# Patient Record
Sex: Female | Born: 1935 | Race: White | Hispanic: No | Marital: Married | Smoking: Never smoker
Health system: Southern US, Community
[De-identification: ages and names within clinical notes are randomized; demographics above are authoritative.]

## PROBLEM LIST (undated history)

## (undated) DIAGNOSIS — G3184 Mild cognitive impairment, so stated: Secondary | ICD-10-CM

## (undated) DIAGNOSIS — R413 Other amnesia: Secondary | ICD-10-CM

## (undated) DIAGNOSIS — Z86010 Personal history of colonic polyps: Secondary | ICD-10-CM

## (undated) DIAGNOSIS — M1990 Unspecified osteoarthritis, unspecified site: Secondary | ICD-10-CM

## (undated) DIAGNOSIS — M810 Age-related osteoporosis without current pathological fracture: Secondary | ICD-10-CM

## (undated) HISTORY — DX: Age-related osteoporosis without current pathological fracture: M81.0

## (undated) HISTORY — DX: Other amnesia: R41.3

## (undated) HISTORY — DX: Personal history of colonic polyps: Z86.010

## (undated) HISTORY — PX: DILATION AND CURETTAGE OF UTERUS: SHX78

## (undated) HISTORY — DX: Unspecified osteoarthritis, unspecified site: M19.90

## (undated) HISTORY — PX: ABDOMINAL HYSTERECTOMY: SHX81

## (undated) HISTORY — PX: TOTAL HIP REVISION: SHX763

## (undated) HISTORY — DX: Mild cognitive impairment, so stated: G31.84

---

## 1999-04-10 ENCOUNTER — Other Ambulatory Visit: Admission: RE | Admit: 1999-04-10 | Discharge: 1999-04-10 | Payer: Self-pay | Admitting: Internal Medicine

## 1999-05-14 ENCOUNTER — Other Ambulatory Visit: Admission: RE | Admit: 1999-05-14 | Discharge: 1999-05-14 | Payer: Self-pay | Admitting: Internal Medicine

## 1999-05-14 ENCOUNTER — Encounter (INDEPENDENT_AMBULATORY_CARE_PROVIDER_SITE_OTHER): Payer: Self-pay

## 2000-02-19 ENCOUNTER — Encounter (INDEPENDENT_AMBULATORY_CARE_PROVIDER_SITE_OTHER): Payer: Self-pay

## 2000-02-19 ENCOUNTER — Other Ambulatory Visit: Admission: RE | Admit: 2000-02-19 | Discharge: 2000-02-19 | Payer: Self-pay | Admitting: Internal Medicine

## 2000-08-26 ENCOUNTER — Encounter: Payer: Self-pay | Admitting: Orthopedic Surgery

## 2000-09-02 ENCOUNTER — Inpatient Hospital Stay (HOSPITAL_COMMUNITY): Admission: RE | Admit: 2000-09-02 | Discharge: 2000-09-05 | Payer: Self-pay | Admitting: Orthopedic Surgery

## 2003-12-05 ENCOUNTER — Ambulatory Visit: Payer: Self-pay | Admitting: Internal Medicine

## 2004-11-08 ENCOUNTER — Ambulatory Visit: Payer: Self-pay | Admitting: Internal Medicine

## 2005-05-26 ENCOUNTER — Ambulatory Visit: Payer: Self-pay | Admitting: Internal Medicine

## 2005-11-18 ENCOUNTER — Ambulatory Visit: Payer: Self-pay | Admitting: Internal Medicine

## 2005-11-18 LAB — CONVERTED CEMR LAB
ALT: 17 units/L (ref 0–40)
AST: 22 units/L (ref 0–37)
Albumin: 3.8 g/dL (ref 3.5–5.2)
Alkaline Phosphatase: 52 units/L (ref 39–117)
BUN: 19 mg/dL (ref 6–23)
Bilirubin, Direct: 0.1 mg/dL (ref 0.0–0.3)
CO2: 32 meq/L (ref 19–32)
Calcium: 9 mg/dL (ref 8.4–10.5)
Chloride: 104 meq/L (ref 96–112)
Chol/HDL Ratio, serum: 3.3
Cholesterol: 210 mg/dL (ref 0–200)
Creatinine, Ser: 0.7 mg/dL (ref 0.4–1.2)
GFR calc non Af Amer: 85 mL/min
Glomerular Filtration Rate, Af Am: 103 mL/min/{1.73_m2}
Glucose, Bld: 82 mg/dL (ref 70–99)
HCT: 38.8 % (ref 36.0–46.0)
HDL: 63.6 mg/dL (ref >39.0)
Hemoglobin: 13.2 g/dL (ref 12.0–15.0)
LDL DIRECT: 120.2 mg/dL
MCHC: 34 g/dL (ref 30.0–36.0)
MCV: 95.9 fL (ref 78.0–100.0)
Platelets: 276 10*3/uL (ref 150–400)
Potassium: 3.7 meq/L (ref 3.5–5.1)
RBC: 4.05 M/uL (ref 3.87–5.11)
RDW: 12.6 % (ref 11.5–14.6)
Sodium: 141 meq/L (ref 135–145)
TSH: 2.13 microintl units/mL (ref 0.35–5.50)
Total Bilirubin: 0.8 mg/dL (ref 0.3–1.2)
Total Protein: 7 g/dL (ref 6.0–8.3)
Triglyceride fasting, serum: 82 mg/dL (ref 0–149)
VLDL: 16 mg/dL (ref 0–40)
WBC: 7.4 10*3/uL (ref 4.5–10.5)

## 2006-10-16 ENCOUNTER — Encounter (INDEPENDENT_AMBULATORY_CARE_PROVIDER_SITE_OTHER): Payer: Self-pay | Admitting: Obstetrics & Gynecology

## 2006-10-16 ENCOUNTER — Inpatient Hospital Stay (HOSPITAL_COMMUNITY): Admission: RE | Admit: 2006-10-16 | Discharge: 2006-10-17 | Payer: Self-pay | Admitting: *Deleted

## 2006-10-21 ENCOUNTER — Telehealth: Payer: Self-pay | Admitting: Internal Medicine

## 2006-11-25 DIAGNOSIS — M810 Age-related osteoporosis without current pathological fracture: Secondary | ICD-10-CM | POA: Insufficient documentation

## 2006-11-25 DIAGNOSIS — Z860100 Personal history of colon polyps, unspecified: Secondary | ICD-10-CM | POA: Insufficient documentation

## 2006-11-25 HISTORY — DX: Personal history of colon polyps, unspecified: Z86.0100

## 2006-11-25 HISTORY — DX: Age-related osteoporosis without current pathological fracture: M81.0

## 2006-11-25 HISTORY — DX: Personal history of colonic polyps: Z86.010

## 2006-11-30 ENCOUNTER — Ambulatory Visit: Payer: Self-pay | Admitting: Internal Medicine

## 2006-11-30 DIAGNOSIS — M1990 Unspecified osteoarthritis, unspecified site: Secondary | ICD-10-CM

## 2006-11-30 HISTORY — DX: Unspecified osteoarthritis, unspecified site: M19.90

## 2006-12-14 ENCOUNTER — Ambulatory Visit (HOSPITAL_BASED_OUTPATIENT_CLINIC_OR_DEPARTMENT_OTHER): Admission: RE | Admit: 2006-12-14 | Discharge: 2006-12-14 | Payer: Self-pay | Admitting: Orthopedic Surgery

## 2007-04-07 ENCOUNTER — Telehealth: Payer: Self-pay | Admitting: Internal Medicine

## 2007-06-22 ENCOUNTER — Telehealth: Payer: Self-pay | Admitting: Internal Medicine

## 2007-10-20 ENCOUNTER — Telehealth: Payer: Self-pay | Admitting: Internal Medicine

## 2008-01-04 ENCOUNTER — Ambulatory Visit: Payer: Self-pay | Admitting: Internal Medicine

## 2008-01-13 ENCOUNTER — Emergency Department (HOSPITAL_COMMUNITY): Admission: EM | Admit: 2008-01-13 | Discharge: 2008-01-13 | Payer: Self-pay | Admitting: Family Medicine

## 2008-05-31 ENCOUNTER — Ambulatory Visit: Payer: Self-pay | Admitting: Internal Medicine

## 2008-05-31 DIAGNOSIS — G3184 Mild cognitive impairment, so stated: Secondary | ICD-10-CM

## 2008-05-31 DIAGNOSIS — R413 Other amnesia: Secondary | ICD-10-CM

## 2008-05-31 HISTORY — DX: Other amnesia: R41.3

## 2008-05-31 HISTORY — DX: Mild cognitive impairment of uncertain or unknown etiology: G31.84

## 2008-06-06 ENCOUNTER — Telehealth: Payer: Self-pay | Admitting: Internal Medicine

## 2008-06-07 ENCOUNTER — Encounter: Payer: Self-pay | Admitting: Internal Medicine

## 2009-01-08 ENCOUNTER — Ambulatory Visit: Payer: Self-pay | Admitting: Internal Medicine

## 2009-03-13 ENCOUNTER — Telehealth: Payer: Self-pay | Admitting: Internal Medicine

## 2009-03-28 ENCOUNTER — Telehealth: Payer: Self-pay | Admitting: Internal Medicine

## 2009-04-02 ENCOUNTER — Telehealth: Payer: Self-pay

## 2009-07-05 ENCOUNTER — Encounter: Payer: Self-pay | Admitting: Internal Medicine

## 2009-07-09 ENCOUNTER — Ambulatory Visit: Payer: Self-pay | Admitting: Internal Medicine

## 2009-09-06 ENCOUNTER — Encounter: Payer: Self-pay | Admitting: Internal Medicine

## 2010-01-10 ENCOUNTER — Ambulatory Visit: Payer: Self-pay | Admitting: Internal Medicine

## 2010-01-10 ENCOUNTER — Encounter: Payer: Self-pay | Admitting: Internal Medicine

## 2010-01-10 LAB — CONVERTED CEMR LAB
AST: 23 units/L (ref 0–37)
Albumin: 3.8 g/dL (ref 3.5–5.2)
Alkaline Phosphatase: 60 units/L (ref 39–117)
BUN: 22 mg/dL (ref 6–23)
Basophils Absolute: 0.1 10*3/uL (ref 0.0–0.1)
CO2: 31 meq/L (ref 19–32)
Chloride: 106 meq/L (ref 96–112)
Creatinine, Ser: 0.8 mg/dL (ref 0.4–1.2)
Eosinophils Absolute: 0.1 10*3/uL (ref 0.0–0.7)
Eosinophils Relative: 1.7 % (ref 0.0–5.0)
Glucose, Bld: 84 mg/dL (ref 70–99)
HCT: 39.7 % (ref 36.0–46.0)
Lymphs Abs: 2.4 10*3/uL (ref 0.7–4.0)
MCHC: 34.3 g/dL (ref 30.0–36.0)
MCV: 99.4 fL (ref 78.0–100.0)
Monocytes Absolute: 0.6 10*3/uL (ref 0.1–1.0)
Neutrophils Relative %: 59.6 % (ref 43.0–77.0)
Platelets: 271 10*3/uL (ref 150.0–400.0)
Potassium: 4.2 meq/L (ref 3.5–5.1)
RDW: 13.8 % (ref 11.5–14.6)
Total Protein: 6.6 g/dL (ref 6.0–8.3)

## 2010-02-24 LAB — CONVERTED CEMR LAB
ALT: 18 units/L (ref 0–35)
ALT: 21 units/L (ref 0–35)
AST: 22 units/L (ref 0–37)
AST: 25 units/L (ref 0–37)
Albumin: 3.6 g/dL (ref 3.5–5.2)
Albumin: 3.7 g/dL (ref 3.5–5.2)
Alkaline Phosphatase: 57 units/L (ref 39–117)
Alkaline Phosphatase: 57 units/L (ref 39–117)
BUN: 17 mg/dL (ref 6–23)
BUN: 19 mg/dL (ref 6–23)
Basophils Absolute: 0.1 10*3/uL (ref 0.0–0.1)
Basophils Absolute: 0.1 10*3/uL (ref 0.0–0.1)
Basophils Relative: 1.2 % (ref 0.0–3.0)
Basophils Relative: 1.3 % (ref 0.0–3.0)
Bilirubin, Direct: 0.1 mg/dL (ref 0.0–0.3)
Bilirubin, Direct: 0.1 mg/dL (ref 0.0–0.3)
CO2: 31 meq/L (ref 19–32)
CO2: 32 meq/L (ref 19–32)
Calcium: 9.1 mg/dL (ref 8.4–10.5)
Calcium: 9.1 mg/dL (ref 8.4–10.5)
Chloride: 104 meq/L (ref 96–112)
Chloride: 108 meq/L (ref 96–112)
Cholesterol: 210 mg/dL (ref 0–200)
Cholesterol: 213 mg/dL — ABNORMAL HIGH (ref 0–200)
Creatinine, Ser: 0.7 mg/dL (ref 0.4–1.2)
Creatinine, Ser: 0.7 mg/dL (ref 0.4–1.2)
Direct LDL: 114.2 mg/dL
Direct LDL: 117.4 mg/dL
Eosinophils Absolute: 0.1 10*3/uL (ref 0.0–0.7)
Eosinophils Absolute: 0.1 10*3/uL (ref 0.0–0.7)
Eosinophils Relative: 0.8 % (ref 0.0–5.0)
Eosinophils Relative: 2 % (ref 0.0–5.0)
GFR calc Af Amer: 103 mL/min
GFR calc non Af Amer: 84.57 mL/min (ref >60)
GFR calc non Af Amer: 85 mL/min
Glucose, Bld: 88 mg/dL (ref 70–99)
Glucose, Bld: 88 mg/dL (ref 70–99)
HCT: 37.9 % (ref 36.0–46.0)
HCT: 38.9 % (ref 36.0–46.0)
HDL: 77.8 mg/dL (ref >39.00)
HDL: 84.3 mg/dL (ref >39.0)
Hemoglobin: 13.2 g/dL (ref 12.0–15.0)
Hemoglobin: 13.2 g/dL (ref 12.0–15.0)
LDL Cholesterol: 114 mg/dL — ABNORMAL HIGH (ref 0–99)
Lymphocytes Relative: 24.7 % (ref 12.0–46.0)
Lymphocytes Relative: 29.3 % (ref 12.0–46.0)
Lymphs Abs: 2.1 10*3/uL (ref 0.7–4.0)
MCHC: 33.9 g/dL (ref 30.0–36.0)
MCHC: 34.9 g/dL (ref 30.0–36.0)
MCV: 100.6 fL — ABNORMAL HIGH (ref 78.0–100.0)
MCV: 97.9 fL (ref 78.0–100.0)
Monocytes Absolute: 0.5 10*3/uL (ref 0.1–1.0)
Monocytes Absolute: 0.7 10*3/uL (ref 0.1–1.0)
Monocytes Relative: 6 % (ref 3.0–12.0)
Monocytes Relative: 9.4 % (ref 3.0–12.0)
Neutro Abs: 4.1 10*3/uL (ref 1.4–7.7)
Neutro Abs: 6.2 10*3/uL (ref 1.4–7.7)
Neutrophils Relative %: 58 % (ref 43.0–77.0)
Neutrophils Relative %: 67.3 % (ref 43.0–77.0)
Platelets: 261 10*3/uL (ref 150.0–400.0)
Platelets: 272 10*3/uL (ref 150–400)
Potassium: 3.8 meq/L (ref 3.5–5.1)
Potassium: 4.1 meq/L (ref 3.5–5.1)
RBC: 3.76 M/uL — ABNORMAL LOW (ref 3.87–5.11)
RBC: 3.97 M/uL (ref 3.87–5.11)
RDW: 12.2 % (ref 11.5–14.6)
RDW: 12.5 % (ref 11.5–14.6)
Sodium: 140 meq/L (ref 135–145)
Sodium: 144 meq/L (ref 135–145)
TSH: 1.63 microintl units/mL (ref 0.35–5.50)
TSH: 2.43 microintl units/mL (ref 0.35–5.50)
Total Bilirubin: 0.9 mg/dL (ref 0.3–1.2)
Total Bilirubin: 1 mg/dL (ref 0.3–1.2)
Total CHOL/HDL Ratio: 2.5
Total CHOL/HDL Ratio: 3
Total Protein: 6.9 g/dL (ref 6.0–8.3)
Total Protein: 6.9 g/dL (ref 6.0–8.3)
Triglycerides: 57 mg/dL (ref 0–149)
Triglycerides: 96 mg/dL (ref 0.0–149.0)
VLDL: 11 mg/dL (ref 0–40)
VLDL: 19.2 mg/dL (ref 0.0–40.0)
WBC: 7.1 10*3/uL (ref 4.5–10.5)
WBC: 9.1 10*3/uL (ref 4.5–10.5)

## 2010-02-28 NOTE — Progress Notes (Signed)
Summary: [facility] ok Brand Aricept only  Phone Note From Pharmacy   Caller: [facility]* Call For: Kim  Summary of Call: Jill Side - pharmacist with [facility] call - generic Aricept will save pt - over $200 per year - if reason for brand only - needs to be added to chart. Call [phone]  opt 1  ref#[phone]. Initial call taken by: Duard Brady LPN,  April 02, 2009 12:01 PM  Follow-up for Phone Call        spoke with [facility] - Brand medically necessary - adverse reaction to generic. Change to chart made.   Follow-up by: Duard Brady LPN,  April 02, 2009 12:02 PM   New Allergies: ! * DONEPEZIL HCL New Allergies: ! * DONEPEZIL HCL  Appended Document: [facility] ok Brand Aricept only Called and spoke with patient / patients husband...  Advised them that PA was approved for medication: Aricept per fax from [facility] / document given to me by [facility].

## 2010-02-28 NOTE — Progress Notes (Signed)
Summary: CONCERNS W/ MEDICATION - Aricelpt brand only   Phone Note Call from Patient   Caller: Spouse [phone] Reason for Call: Talk to Nurse, Talk to Doctor Summary of Call: Pts husband Jomarie Longs) called to adv that his wife took generic med for Aricept (Donepezil Hydrochloride) for the first time last night and was up all night.... Pts husband states that he read insomnia was a side effect of that med, therefore spouse wants to know if pt should go back to brand name (aricept) or if medication can be given during another time of day other than at night..??   He req to speak with Dr Kirtland Bouchard or RN ref to medication change or change in time of day med is taken.... Pt / Pts husband can be reached at [phone]  or  [phone].  Initial call taken by: Debbra Riding,  March 28, 2009 9:10 AM  Follow-up for Phone Call        OK to resume branded aricept and call in new Rx if needed Follow-up by: Gordy Savers  MD,  March 28, 2009 12:19 PM  Additional Follow-up for Phone Call Additional follow up Details #1::        New Rx for aricept to [facility]. Additional Follow-up by: Debbra Riding,  March 28, 2009 1:39 PM    Additional Follow-up for Phone Call Additional follow up Details #2::    spoke with husband - informed that Dr. Venetia Night - will call out to [facility] , they will need short term rx called to [facility]. [facility] . KIK Follow-up by: Duard Brady LPN,  March 29, 2009 11:47 AM  Prescriptions: ARICEPT 5 MG TABS (DONEPEZIL HCL) one daily  #90 x 4   Entered by:   Duard Brady LPN   Authorized by:   Gordy Savers  MD   Signed by:   Duard Brady LPN on 18/84/1660   Method used:   Electronically to        [facility]* (mail-order)             ,          Ph: [phone]       Fax: [phone]   RxID:   [id number]  #30 called to [facility] - Brand medically necessary.  KIK

## 2010-02-28 NOTE — Assessment & Plan Note (Signed)
Summary: emp/pt coming in fasting/cjr   Vital Signs:  Patient profile:   77 year old female Height:      58.5 inches Weight:      139 pounds BMI:     28.66 Temp:     98.2 degrees F oral BP sitting:   126 / 78  (left arm) Cuff size:   regular  Vitals Entered By: Duard Brady LPN (January 10, 2010 8:40 AM) CC: cpx - doing ok Is Patient Diabetic? No   CC:  cpx - doing ok.  History of Present Illness: 29 -year-old patient who is seen today for a health maintenance examination.medical problems include mild cognitive impairment.  She has osteoporosis and osteoarthritis.  She has a history of colonic polyps.  She is well today without concerns or complaints.  She has been on Fosamax for an excess of 5 years.  Here for Medicare AWV:  1.   Risk factors based on Past M, S, F history:  cardiovascular risk factors include age 26.   Physical Activities: fairly active.  Mild exercise limitations due to arthritis 3.   Depression/mood: history depression, or mood disorder 4.   Hearing: no major deficits 5.   ADL's: independent in all aspects of daily living 6.   Fall Risk: moderate due to age 106.   Home Safety: no problems identified 8.   Height, weight, &visual acuity:height and weight stable.  No difficulty with visual acuity appeared 9.   Counseling: calcium and vitamin D supplementation, and encouraged 10.   Labs ordered based on risk factors: laboratory profile will be reviewed 11.           Referral Coordination-  none appropriate at this time 12.           Care Plan- heart healthy diet more regular exercise.  Encouraged 13.            Cognitive Assessment- alert and oriented, with normal affect.  History mild cognitive dysfunction   Allergies: 1)  ! * Donepezil Hcl  Past History:  Past Medical History: Reviewed history from 05/31/2008 and no changes required. Colonic polyps, hx of Osteoporosis Osteoarthritis insomnia cognitive impairment  Past Surgical  History: Reviewed history from 01/04/2008 and no changes required. 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 Family History Seizures father died age 53, history of seizure disorder mother  accidental  death at age 100 one brother one sister in good health other than osteoarthritis  Social History: Reviewed history from 11/25/2006 and no changes required. Retired Married Never Smoked  Review of Systems  The patient denies anorexia, fever, weight loss, weight gain, vision loss, decreased hearing, hoarseness, chest pain, syncope, dyspnea on exertion, peripheral edema, prolonged cough, headaches, hemoptysis, abdominal pain, melena, hematochezia, severe indigestion/heartburn, hematuria, incontinence, genital sores, muscle weakness, suspicious skin lesions, transient blindness, difficulty walking, depression, unusual weight change, abnormal bleeding, enlarged lymph nodes, angioedema, and breast masses.    Physical Exam  General:  Well-developed,well-nourished,in no acute distress; alert,appropriate and cooperative throughout examination Head:  Normocephalic and atraumatic without obvious abnormalities. No apparent alopecia or balding. Eyes:  No corneal or conjunctival inflammation noted. EOMI. Perrla. Funduscopic exam benign, without hemorrhages, exudates or papilledema. Vision grossly normal. Ears:  External ear exam shows no significant lesions or deformities.  Otoscopic examination reveals clear canals, tympanic membranes are intact bilaterally without bulging, retraction,  inflammation or discharge. Hearing is grossly normal bilaterally. Nose:  External nasal examination shows no deformity or inflammation. Nasal mucosa are pink and moist without lesions or exudates. Mouth:  Oral mucosa and oropharynx without lesions or  exudates.  Teeth in good repair. Neck:  No deformities, masses, or tenderness noted. Chest Wall:  No deformities, masses, or tenderness noted. Breasts:  No mass, nodules, thickening, tenderness, bulging, retraction, inflamation, nipple discharge or skin changes noted.   Lungs:  Normal respiratory effort, chest expands symmetrically. Lungs are clear to auscultation, no crackles or wheezes. Heart:  Normal rate and regular rhythm. S1 and S2 normal without gallop, murmur, click, rub or other extra sounds. Abdomen:  Bowel sounds positive,abdomen soft and non-tender without masses, organomegaly or hernias noted. Rectal:  No external abnormalities noted. Normal sphincter tone. No rectal masses or tenderness. Genitalia:  scarring along the anterior vaginal wall, possible small cystocele Msk:  No deformity or scoliosis noted of thoracic or lumbar spine.   Pulses:  R and L carotid,radial,femoral,dorsalis pedis and posterior tibial pulses are full and equal bilaterally Extremities:  No clubbing, cyanosis, edema, or deformity noted with normal full range of motion of all joints.   Neurologic:  No cranial nerve deficits noted. Station and gait are normal. Plantar reflexes are down-going bilaterally. DTRs are symmetrical throughout. Sensory, motor and coordinative functions appear intact. Skin:  Intact without suspicious lesions or rashes Cervical Nodes:  No lymphadenopathy noted Axillary Nodes:  No palpable lymphadenopathy Inguinal Nodes:  No significant adenopathy Psych:  Cognition and judgment appear intact. Alert and cooperative with normal attention span and concentration. No apparent delusions, illusions, hallucinations   Impression & Recommendations:  Problem # 1:  PREVENTIVE HEALTH CARE (ICD-V70.0)  Orders: EKG w/ Interpretation (93000) Medicare -1st Annual Wellness Visit [phone]) Venipuncture (26948) TLB-BMP (Basic Metabolic Panel-BMET) (80048-METABOL) TLB-CBC Platelet - w/Differential  (85025-CBCD) TLB-Hepatic/Liver Function Pnl (80076-HEPATIC) TLB-TSH (Thyroid Stimulating Hormone) (84443-TSH) Specimen Handling (54627)  Complete Medication List: 1)  Temazepam 15 Mg Caps (Temazepam) .Marland Kitchen.. 1 at bedtime as needed 2)  Ocuvite Tabs (Multiple vitamins-minerals) 3)  Glucosamine Sulfate 500 Mg Caps (Glucosamine sulfate) 4)  Aricept 5 Mg Tabs (Donepezil hcl) .... One daily 5)  Relafan 500mg   .... One twice daily 6)  Tramadol Hcl 50 Mg Tabs (Tramadol hcl) .Marland Kitchen.. 1 by mouth prn  Patient Instructions: 1)  Please schedule a follow-up appointment in 1 year. 2)  Limit your Sodium (Salt). 3)  It is important that you exercise regularly at least 20 minutes 5 times a week. If you develop chest pain, have severe difficulty breathing, or feel very tired , stop exercising immediately and seek medical attention. 4)  Take calcium +Vitamin D daily. Prescriptions: ARICEPT 5 MG TABS (DONEPEZIL HCL) one daily  #90 x 4   Entered and Authorized by:   Gordy Savers  MD   Signed by:   Gordy Savers  MD on 01/10/2010   Method used:   Electronically to        [facility]* (retail)             ,          Ph: [phone]       Fax: [phone]   RxID:   [id number]  And I and  Orders Added: 1)  EKG w/ Interpretation [93000] 2)  Medicare -1st Annual Wellness Visit [G0438] 3)  Est. Patient Level III [77824] 4)  Venipuncture [23536] 5)  TLB-BMP (Basic Metabolic Panel-BMET) [80048-METABOL] 6)  TLB-CBC Platelet -  w/Differential [85025-CBCD] 7)  TLB-Hepatic/Liver Function Pnl [80076-HEPATIC] 8)  TLB-TSH (Thyroid Stimulating Hormone) [84443-TSH] 9)  Specimen Handling [99000]

## 2010-02-28 NOTE — Progress Notes (Signed)
Summary: REQ FOR RETURN CALL - ok on generic  Phone Note Call from Patient   Caller: Patient [phone] Reason for Call: Talk to Nurse, Talk to Doctor Summary of Call: Pts husband Jomarie Longs) called to make sure that it will be okay for his wife to take the generic med for Aricept that she was sent by [facility].... He req to speak with Dr Kirtland Bouchard or RN ref to medication.... Pt / Pts husband can be reached at [phone].   Initial call taken by: Debbra Riding,  March 13, 2009 11:58 AM  Follow-up for Phone Call        ok to take Follow-up by: Gordy Savers  MD,  March 13, 2009 4:52 PM  Additional Follow-up for Phone Call Additional follow up Details #1::        spoke with husband - informed that ok to take genric.  Additional Follow-up by: Duard Brady LPN,  March 13, 2009 5:00 PM

## 2010-02-28 NOTE — Letter (Signed)
Summary: [facility]  [facility]   Imported By: Maryln Gottron 09/19/2009 14:14:35  _____________________________________________________________________  External Attachment:    Type:   Image     Comment:   External Document

## 2010-02-28 NOTE — Assessment & Plan Note (Signed)
Summary: 6 MONTH ROV/NJR   Vital Signs:  Cummings profile:   77 year old female Weight:      138 pounds Temp:     98.2 degrees F oral BP sitting:   120 / 76  (right arm) Cuff size:   regular  Vitals Entered By: Duard Brady LPN (July 09, 2009 10:31 AM) CC: 6 mos rov - doing ok Is Cummings Diabetic? No   CC:  6 mos rov - doing ok.  History of Present Illness: Hayley Cummings who is seen today for her 6 month follow-up.  She has a history of mild cognitive impairment osteoarthritis and osteoporosis.  She is doing quite well.  No new concerns or complaints.  Allergies: 1)  ! * Donepezil Hcl  Past History:  Past Medical History: Reviewed history from 05/31/2008 and no changes required. Colonic polyps, hx of Osteoporosis Osteoarthritis insomnia cognitive impairment  Past Surgical History: Reviewed history from 01/04/2008 and no changes required. 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 Family History Seizures father died age 23, history of seizure disorder mother  accidental  death at age 22 one brother one sister in good health other than osteoarthritis  Social History: Reviewed history from 11/25/2006 and no changes required. Retired Married Never Smoked  Review of Systems  The Cummings denies anorexia, fever, weight loss, weight gain, vision loss, decreased hearing, hoarseness, chest pain, syncope, dyspnea on exertion, peripheral edema, prolonged cough, headaches, hemoptysis, abdominal pain, melena, hematochezia, severe indigestion/heartburn, hematuria, incontinence, genital sores, muscle weakness, suspicious skin lesions, transient blindness, difficulty walking, depression, unusual weight change, abnormal bleeding, enlarged lymph nodes, angioedema, and breast masses.    Physical  Exam  General:  Well-developed,well-nourished,in no acute distress; alert,appropriate and cooperative throughout examination Head:  Normocephalic and atraumatic without obvious abnormalities. No apparent alopecia or balding. Eyes:  No corneal or conjunctival inflammation noted. EOMI. Perrla. Funduscopic exam benign, without hemorrhages, exudates or papilledema. Vision grossly normal. Mouth:  Oral mucosa and oropharynx without lesions or exudates.  Teeth in good repair. Neck:  No deformities, masses, or tenderness noted. Lungs:  Normal respiratory effort, chest expands symmetrically. Lungs are clear to auscultation, no crackles or wheezes. Heart:  Normal rate and regular rhythm. S1 and S2 normal without gallop, murmur, click, rub or other extra sounds. Abdomen:  Bowel sounds positive,abdomen soft and non-tender without masses, organomegaly or hernias noted. Msk:  No deformity or scoliosis noted of thoracic or lumbar spine.   Pulses:  R and L carotid,radial,femoral,dorsalis pedis and posterior tibial pulses are full and equal bilaterally Extremities:  No clubbing, cyanosis, edema, or deformity noted with normal full range of motion of all joints.     Impression & Recommendations:  Problem # 1:  MILD COGNITIVE IMPAIRMENT SO STATED (ICD-331.83)  Problem # 2:  OSTEOARTHRITIS (ICD-715.90)  The following medications were removed from the medication list:    Hydrocodone-acetaminophen 5-500 Mg Tabs (Hydrocodone-acetaminophen) .Marland Kitchen... 1 three times a day as needed  The following medications were removed from the medication list:    Hydrocodone-acetaminophen 5-500 Mg Tabs (Hydrocodone-acetaminophen) .Marland Kitchen... 1 three times a day as needed  Problem # 3:  OSTEOPOROSIS (ICD-733.00)  Her updated medication list for this problem includes:    Fosamax 70 Mg Tabs (Alendronate sodium) .Marland Kitchen... 1 q week  Her updated medication list for this problem includes:    Fosamax 70 Mg  Tabs (Alendronate sodium) .Marland Kitchen... 1 q  week  Complete Medication List: 1)  Temazepam 15 Mg Caps (Temazepam) .Marland Kitchen.. 1 at bedtime as needed 2)  Fosamax 70 Mg Tabs (Alendronate sodium) .Marland Kitchen.. 1 q week 3)  Ocuvite Tabs (Multiple vitamins-minerals) 4)  Glucosamine Sulfate 500 Mg Caps (Glucosamine sulfate) 5)  Aricept 5 Mg Tabs (Donepezil hcl) .... One daily 6)  Relafan 500mg   .... One twice daily  Cummings Instructions: 1)  Please schedule a follow-up appointment in 6 months CPX  2)  Advised not to eat any food or drink any liquids after 10 PM the night before your procedure. 3)  It is important that you exercise regularly at least 20 minutes 5 times a week. If you develop chest pain, have severe difficulty breathing, or feel very tired , stop exercising immediately and seek medical attention. 4)  Take calcium +Vitamin D daily. Prescriptions: RELAFAN 500MG  one twice daily  #180 x 4   Entered and Authorized by:   Gordy Savers  MD   Signed by:   Gordy Savers  MD on 07/09/2009   Method used:   Print then Give to Cummings   RxID:   [id number] ARICEPT 5 MG TABS (DONEPEZIL HCL) one daily  #90 x 4   Entered and Authorized by:   Gordy Savers  MD   Signed by:   Gordy Savers  MD on 07/09/2009   Method used:   Print then Give to Cummings   RxID:   [id number] FOSAMAX 70 MG  TABS (ALENDRONATE SODIUM) 1 q week  #12 x 6   Entered and Authorized by:   Gordy Savers  MD   Signed by:   Gordy Savers  MD on 07/09/2009   Method used:   Print then Give to Cummings   RxID:   [id number] TEMAZEPAM 15 MG  CAPS (TEMAZEPAM) 1 at bedtime as needed  #90 x 4   Entered and Authorized by:   Gordy Savers  MD   Signed by:   Gordy Savers  MD on 07/09/2009   Method used:   Print then Give to Cummings   RxID:   [id number]

## 2010-02-28 NOTE — Medication Information (Signed)
Summary: Coverage Approval for Aricept  Coverage Approval for Aricept   Imported By: Maryln Gottron 07/11/2009 09:11:09  _____________________________________________________________________  External Attachment:    Type:   Image     Comment:   External Document

## 2010-05-14 ENCOUNTER — Other Ambulatory Visit: Payer: Self-pay | Admitting: Internal Medicine

## 2010-05-14 MED ORDER — TEMAZEPAM 15 MG PO CAPS: 15.0000 mg | ORAL_CAPSULE | Freq: Every evening | ORAL | Status: DC | PRN

## 2010-05-14 NOTE — Telephone Encounter (Signed)
Pt called and said that Temazepam 15 mg, needs to be sent in to [facility] mail order pharmacy asap.

## 2010-06-11 NOTE — Op Note (Signed)
NAMEVIVIANN, Hayley Cummings              ACCOUNT NO.:  [id number]   MEDICAL RECORD NO.:  [id number]          PATIENT TYPE:  AMB   LOCATION:  SDC                           FACILITY:  [facility]   PHYSICIAN:  Genia Del, M.D.DATE OF BIRTH:  02/20/1922   DATE OF PROCEDURE:  10/16/2006  DATE OF DISCHARGE:                               OPERATIVE REPORT   PREOPERATIVE DIAGNOSES:  1. Uterine prolapse, grade 1/3.  2. Cystocele, grade 4/4.  3. Rectocele, grade 1/3.  and rectocele, grade 1/3.   POSTOPERATIVE DIAGNOSES:  1. Uterine prolapse, grade 1/3.  2. Cystocele, grade 4/4.  3. Rectocele, grade 1/3.   OPERATION/PROCEDURE:  Vaginal hysterectomy with anterior and posterior  colporrhaphy.   SURGEON:  Genia Del, M.D.   ASSISTANT:  Lendon Colonel, M.D.   ANESTHESIOLOGIST:  Octaviano Glow. Pamalee Leyden, M.D.   PROCEDURE:  Under spinal anesthesia, the patient is in lithotomy  position.  She is prepped with Betadine on the suprapubic vulvar and  vaginal areas and draped as usual.  Her pessary is removed and a Foley  catheter is put in place.  The vaginal exam reveals a small anteverted  uterus with a prolapsus, 1/3.  No adnexal mass.  There is a cystocele,  grade 4/4, and a rectocele, grade 1/3.  We put the weighted speculum in  the vagina.  We grasp the cervix with tenaculums.  We infiltrate the  junction between the cervix and vaginal mucosa circumferentially with  epinephrine with Xylocaine 1%.  We then opened at that level with the  electrocautery.  We placed the vaginal mucosa anteriorly and posteriorly  and opened the visceral peritoneum at those levels with the Metzenbaum  scissors.  We positioned a retractor anteriorly to push the bladder away  from the surgical area and we used a long narrow weighted speculum  posteriorly.  We clamped the uterosacral ligament on the left side with  the curved Heaney.  We section with Mayo scissors and attach with a  Vicryl 0 in a Heaney stitch.  We  proceed the same way on the right side.  We keep those on hemostats. We then used the LigaSure to cauterize and  then sectioned with Metzenbaum scissors the left uterine artery and then  the right.  We then continued along the uterus, cauterizing and  sectioning the utero-ovarian ligaments and the tubes on each side.  We  detach the uterus completely and send it to pathology.  The two ovaries  are normal in size and appearance so they are left in place.  Hemostasis  is verified on all pedicles.  It is completed with an additional Vicryl  0 on the right uterosacral ligament.  We then close the vaginal vault  with figure-of-eight with Vicryl 0 vertically.  We attach as we go along  the two uterosacral ligaments on the midline.  We then proceeded with  the anterior colporrhaphy.  We used Allises.  We dissected  the vaginal  mucosa anteriorly from the vaginal vault to about 2 cm from the urethral  meatus.  We then retract the bladder from the vaginal mucosa  on each  side.  We reinforce the fascia with circular stitches at first with  Vicryl 2-0 and then from side-to-side to completely reduce the  cystocele.  We then section the excess mucosa on each side and close the  vaginal mucosa with a running locked suture of Vicryl 2-0.  We then  proceed with the posterior colporrhaphy.  We use Allises as well.  We  start at the perineum and make a triangular opening of the skin at that  level and resect the skin.  We then __________  the posterior vaginal  mucosa with the __________ .  We opened the vaginal mucosa to a point at  about 2 cm from the vaginal apex.  We then retract the rectocele on each  side from the vaginal mucosa.  We reduce the rectocele with Vicryl 2-0  from side-to-side and we then resect the excess vaginal mucosa on each  side with Mayo scissors and close the vaginal mucosa posteriorly with a  running locked suture of Vicryl 2-0.  We put an X stitch of Vicryl 2-0  on the perineal  body to tighten that area and we then close the skin and  the perineum with a Vicryl 3-0.  Hemostasis is adequate at all levels.  We use a 1 inch tacking with Estradiol cream in the vagina.  The packing  will be removed tomorrow.  A dose of Ancef 1 gram IV was given at the  induction.  Estimated blood loss was 50 mL.  No complications occurred  and the patient was brought to recovery room in good stable status.      Genia Del, M.D.  Electronically Signed     ML/MEDQ  D:  10/16/2006  T:  10/16/2006  Job:  [id number]

## 2010-06-11 NOTE — Discharge Summary (Signed)
NAMEHAYLE, PARISI              ACCOUNT NO.:  [id number]   MEDICAL RECORD NO.:  [id number]          PATIENT TYPE:  INP   LOCATION:  9316                          FACILITY:  [facility]   PHYSICIAN:  Genia Del, M.D.DATE OF BIRTH:  04/01/1922   DATE OF ADMISSION:  10/16/2006  DATE OF DISCHARGE:  10/17/2006                               DISCHARGE SUMMARY   ADMISSION DIAGNOSES:  1. Uterine prolapse grade 1 out of 3.  2. Symptomatic cystocele, grade 4 out of 4.  3. Rectocele, grade 1 out of 3.   DISCHARGE DIAGNOSES:  1. Uterine prolapse grade 1 out of 3.  2. Symptomatic cystocele, grade 4 out of 4.  3. Rectocele, grade 1 out of 3.   PROCEDURE:  Vaginal hysterectomy with anterior and posterior repair  surgery without complications.   ESTIMATED BLOOD LOSS:  50 mL.   Postop evolution was good.  She had a hemoglobin of 11.2 postop.  She  was discharged on October 17, 2006, in good stable status.  Postop  advice given and she will follow up at [facility] in 3 weeks.      Genia Del, M.D.  Electronically Signed     ML/MEDQ  D:  12/04/2006  T:  12/04/2006  Job:  [id number]

## 2010-06-11 NOTE — Op Note (Signed)
Hayley Cummings, Hayley Cummings              ACCOUNT NO.:  [id number]   MEDICAL RECORD NO.:  [id number]          PATIENT TYPE:  AMB   LOCATION:  DSC                          FACILITY:  [facility]   PHYSICIAN:  Feliberto Gottron. Turner Daniels, M.D.   DATE OF BIRTH:  1922/04/06   DATE OF PROCEDURE:  12/14/2006  DATE OF DISCHARGE:                               OPERATIVE REPORT   PREOPERATIVE DIAGNOSIS:  Right knee symptomatic medial and lateral  meniscal tears and chondromalacia with loose bodies.   POSTOPERATIVE DIAGNOSIS:  Right knee symptomatic medial and lateral  meniscal tears and chondromalacia with loose bodies.   PROCEDURE:  Right knee arthroscopic partial medial and lateral  meniscectomies for a complex tearing of both menisci, removal of  cartilaginous loose bodies and debridement of chondromalacia grade 3  focal, grade 4 to the patella, trochlea, medial lateral femoral  condyles.   SURGEON:  Feliberto Gottron.  Turner Daniels, MD   FIRST ASSISTANT:  Skip Mayer PA-C.   ANESTHETIC:  General LMA.   ESTIMATED BLOOD LOSS:  Minimal.   FLUID REPLACEMENT:  700 mL crystalloid.   DRAINS PLACED:  None.   TOURNIQUET TIME:  None.   INDICATIONS FOR PROCEDURE:  An 77 year old woman who has known moderate  to severe arthritis of the left knee and the right knee which by x-ray  looked better than the left recently became symptomatic with catching,  locking and popping that has failed conservative treatment with anti-  inflammatory medicines, cortisone injections provided only temporary  relief and she is now having trouble ambulating, quite tender along the  lateral greater than medial joint line.  She desires elective  arthroscopic evaluation and treatment of her right knee to remove  presumed chondromalacia and meniscal tears.  Risks and benefits of  surgery discussed, questions answered.   DESCRIPTION OF PROCEDURE:  The patient identified by armband, taken the  operating room at [facility] day surgery center.  Appropriate site  monitors  were attached and general LMA anesthesia induced with the patient in  supine position.  Lateral post was applied to the table and the right  lower extremity prepped and draped in usual sterile fashion from the  ankle to the mid thigh.  Using a #11 blade standard inferomedial,  inferolateral peripatellar portals were then made allowing introduction  of the arthroscope with the inflow attached and pressure set between 60  and 80 mmHg for normal saline solution plus 1:100,000 epinephrine.  Diagnostic arthroscopy revealed multiple small cartilaginous loose  bodies in the joint fluid which was flushed out using the inferomedial  portal and an outflow and we then used a 3.5 gator sucker shaver to  remove some small flap tears to the patella and trochlea although there  were some areas that were actually down to grade 4.  On the medial  femoral condyle same thing, the cartilage had thinned out down to a  millimeter or so in some focal areas and this was lightly debrided.  Degenerative tearing the medial meniscus was removed, the cruciate  ligaments were noted to be intact.  On the lateral side the patient had  very large complex tearing of the lateral meniscus pretty much from  anterior all the way to posterior and two or three of these flaps were  easily going in and out of the joint and these were removed with a 3.5  gator sucker shaver.  The articular cartilage of the lateral femoral  condyle also had some grade 3 changes with flap tears and this was  debrided back to stable margin as well.  The gutters were cleared  medially and laterally.  The knee was irrigated out normal saline  solution, arthroscopic instruments removed.  A dressing of Xeroform, 4x4  dressing sponges, Webril and Ace wrap applied.  The patient awakened and  taken to the recovery room without difficulty.      Feliberto Gottron. Turner Daniels, M.D.  Electronically Signed     FJR/MEDQ  D:  12/14/2006  T:  12/14/2006  Job:   [id number]

## 2010-06-14 NOTE — Assessment & Plan Note (Signed)
[facility]                              [facility] OFFICE NOTE   NAME:Preisler, DENIS KOPPEL                     MRN:          [id number]  DATE:11/18/2005                            DOB:          1922/05/29    HISTORY OF PRESENT ILLNESS:  The patient is an 77 year old female seen today  for an annual exam.  She is followed by Dr. Carey Bullocks for gynecologic care.  She has a history of osteoporosis, colonic polyps.  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  She has had right hip surgery in 1992, which also required a  revision.  She is followed by Dr. Chaney Malling.  She has had a remote D and C.  She is a gravida 5, para 4, abortus 1.   MEDICATIONS:  Her medical regimen includes:  1. Fosamax.  2. Vitamin/mineral supplements.   REVIEW OF SYSTEMS:  Review of systems is fairly noncontributory.  Last  colonoscopy was in 2004.  She does receive annual mammograms.  Pneumovax was  done last year.   FAMILY HISTORY:  Unchanged.  One brother, one sister.  History of  osteoarthritis.  Mother died accidental death at 48.  Father died at 2 with  a history of a seizure disorder.   PHYSICAL EXAMINATION:  GENERAL APPEARANCE:  Exam revealed a healthy-  appearing, fit, elderly white female in no acute distress.  VITAL SIGNS:  Blood pressure 122/78.  SKIN:  Negative.  HEENT:  Fundi, ears, nose and throat unremarkable.  NECK:  No bruits.  CHEST:  Clear.  BREASTS:  Negative.  CARDIOVASCULAR:  Normal heart sounds. No murmurs.  ABDOMEN:  Benign.  There is a surgical scar noted in the left upper  quadrant.  No bruits appreciated.  EXTREMITIES:  Reveal intact peripheral pulses, no edema.  NEUROLOGICAL:  Negative.   IMPRESSION:  1. Osteoporosis.  2. Degenerative joint disease.  3. History of colonic polyps.  4. Chronic insomnia.   DISPOSITION:  Medical regimen unchanged.  A tetanus toxoid was administered  as well as a flu vaccine.  She will consider return at her  convenience for a  zoster vaccine, otherwise return in one year for followup.            ______________________________  Gordy Savers, MD     PFK/MedQ  DD:  11/18/2005  DT:  11/19/2005  Job #:  [id number]

## 2010-06-14 NOTE — Discharge Summary (Signed)
[place]  Patient:    Hayley Cummings, Hayley Cummings Visit Number: [id number] MRN: [id number]          Service Type: SUR Location: [id number] Attending Physician:  Alinda Deem Dictated by:   Dorthula Matas, P.A.-C. Admit Date:  09/02/2000 Discharge Date: 09/05/2000                             Discharge Summary  PRIMARY DIAGNOSIS:  Loose right total hip arthroplasty.  PROCEDURE:  Revision right total hip arthroplasty.  HISTORY OF PRESENT ILLNESS:  The patient is a 77 year old woman who had a reverse ______ DePuy total hip arthroplasty in 1993.  The patient has had progressive loosening of the acetabular component with superior migration, increasing pain, and now desires elective revision of right total hip arthroplasty.  No history of subluxations or dislocations.  ALLERGIES:  No known drug allergies.  MEDICATIONS AT TIME OF ADMISSION: 1. Fosamax 70 mg weekly. 2. Iron supplements q.d. 3. Vitamins. 4. Herbal treatment.  PAST MEDICAL HISTORY:  Usual childhood diseases.  Adult diseases: Osteoporosis and DJD.  Denies all other surgical history.  Right total hip arthroplasty in 1993.  No difficulties or ______.  SOCIAL HISTORY:  No tobacco.  Positive ethanol, three glasses of wine per week.  No IV drug abuse.  She is married.  She is employed as a Neurosurgeon.  FAMILY HISTORY:  Family history is reviewed.  Mother and father both died in their 58s secondary to a motor vehicle accident.  No significant medical history.  REVIEW OF SYSTEMS:  Positive glasses, no dentures.  Denies shortness of breath or chest pain.  No recent infection.  PHYSICAL EXAMINATION:  VITAL SIGNS:  Temperature 98.2, pulse 68, respiratory rate 12, blood pressure 120/74.  The patient is a 5 foot 1 inch 132 pound female.  HEENT:  Normocephalic, atraumatic.  Ears:  TMs clear.  Eyes:  Pupils are equal, round and reactive to light and accommodation.  Nose is patent.   Throat is benign.  NECK:  Supple.  Full range of motion.  CHEST:  Clear to auscultation.  HEART:  Regular rate and rhythm.  ABDOMEN:  Bowel sounds 2+.  No masses.  Nontender.  EXTREMITIES:  Right leg is 0.5 cm shorter than the left.  There is no pain internal/external rotation, maybe a foot tap.  Otherwise, neurovascularly intact with a well-healed normal scar.  LABORATORY DATA:  Preoperative labs including CBC, CMET, chest x-ray, EKG, PT, and PTT were all within normal limits  with the exception of normal sinus rhythm with first-degree AV block on her EKG.  Hemoglobin 10, hematocrit 28.0.  X-rays:  Plain radiograph showed migration of well-cemented Duraloc cup with migration through the posterior wall thinning.  HOSPITAL COURSE:  The patient was taken to the operating room at [facility] the day of admission where she underwent a right revision total hip arthroplasty with a 60 mm DePuy sector jumbo cup with three down screw holes, a 10-degree liner, and exterior and superior to accept an NK+5 28-mm head which was a revision of the femoral component.  The patient was placed on perioperative antibiotic prophylaxis for the first 72 hours.  She was placed on Coumadin prophylaxis with a target INR of 1.5 to 1.2 for two weeks postoperatively per pharmacy protocol.  Foley catheter was placed preoperatively and the patient was placed on postoperative PCA for pain control.  Postoperative  day #1, the patient was awake and alert and in moderate pain.  She was using her PCA well without difficulty.  She was neurovascularly intact.  Dressing was dry and she was otherwise stable. Physical therapy was begun.  On the second postoperative day, the patient was without complaint.  She was making progress with PT, T-max of 101.0, range of 97.9.  No other abnormalities in vitals.  Hemoglobin 10.8.  She continued with physical therapy and discharged planning.  Postop day #3, the  patient was without complaint.  No cough of shortness of breath.  T-max was 101.3 but a range of 98.8 that morning.  Hemoglobin 10.0.  Cultures were negative that were taken at the time of surgery.  INR of 1.8.  Dressing and wound were benign.  Calf was soft and she was otherwise neurovascularly intact and, because PT goals were met, she was discharged home to the care of her family.  WOUND CARE:  Dressing changes q.d.  DIET:  Regular.  ACTIVITY:  Weightbearing as tolerated with total hip precautions.  DISCHARGE MEDICATIONS:  Tylox and Coumadin with prescriptions in chart.  FOLLOW-UP:  Return to clinic in one weeks time for followup check. Dictated by:   Dorthula Matas, P.A.-C. Attending Physician:  Alinda Deem DD:  10/05/00 TD:  10/05/00 Job: [id number]

## 2010-06-14 NOTE — Assessment & Plan Note (Signed)
[facility]                                 ON-CALL NOTE   NAME:SCUTTIBeautiful, Pensyl                       MRN:          [id number]  DATE:01/04/2006                            DOB:          1922/05/12    Patient of Dr. Frederica Kuster.   The patient calling at 8 a.m. stating she feels fine, but she wants to  see Dr. Amador Cunas in the office tomorrow.  I explained all she would  need to do is call the office and make an appointment.     Jeffrey A. Tawanna Cooler, MD  Electronically Signed    JAT/MedQ  DD: 01/04/2006  DT: 01/04/2006  Job #: [id number]

## 2010-06-14 NOTE — Op Note (Signed)
[place]  Patient:    NERI, VIEYRA                     MRN: [id number] Proc. Date: 09/02/00 Adm. Date:  [id number] Attending:  Alinda Deem                           Operative Report  PREOPERATIVE DIAGNOSIS:  Loose right total hip arthroplasty.  POSTOPERATIVE DIAGNOSIS:  Loose right total hip arthroplasty.  PROCEDURE:  Revision right total hip arthroplasty using a 60 mm Depuy Sector jumbo cup with three dome screw holes, 10 degree liner, indexed posterior and superior to accept an NK +5 28 mm head, which was a revision of the femoral component.  SURGEON:  Alinda Deem, M.D.  FIRST ASSISTANT:  Dorthula Matas, P.A.-C.  ANESTHESIA:  General endotracheal.  ESTIMATED BLOOD LOSS:  500 cc.  FLUID REPLACEMENT:  2 L of crystalloid.  DRAINS PLACED:  Foley catheter.  URINE OUTPUT:  300 cc.  INDICATIONS FOR PROCEDURE:  A 77 year old woman who had a reverse hybrid DePuy total hip arthroplasty put in in 1993.  She has had progressive loosening of the acetabular component with superior migration and increasing pain and now desires elective revision right total hip arthroplasty.  The five-eighths coated DePuy stem looks to be in good condition.  She is 77 years old.  DESCRIPTION OF PROCEDURE:  Patient identified by arm band and taken to the operating room at [facility] main hospital.  Appropriate anesthetic monitors were attached, general endotracheal anesthesia induced with the patient in the supine position.  She was then rolled into the left lateral decubitus position and fixed over the [employer] II pelvic clamp.  Right lower extremity prepped and draped in the usual sterile fashion from the ankle to the hemipelvis and the skin along the lateral hip and thigh infiltrated with 15 cc of 0.5% Marcaine and epinephrine solution.  Using the old posterolateral skin incision, we approached the hip through the skin and subcutaneous  tissue down to the iliotibial band and made a similar incision in the IT band.  This exposed the greater trochanter and gluteus minimus and pseudocapsule along the intertrochanteric crest.  An incision was made from the posterior edge of the gluteus medius along the pseudocapsule as it inserted on the intertrochanteric crest and then down inferiorly toward the cotyloid notch.  We tagged this flap with three #2 Ethibond sutures and peeled it posteriorly, exposing the femoral implant and the joint.  Fluid was sent for Gram stain and culture and came back negative.  We stripped the capsule posteriorly, finding the posterior edge of the acetabulum, which was waddled out from the loose cemented socket. Pseudocapsule was removed anteriorly, allowing [place] to flex and internally rotate the hip, dislocating the femoral stem, which was well-fixed.  The +5 ball was then taken off with a drift and a hammer and the femur tucked up superiorly and anterior on the iliac crest, allowing [place] to complete our exposure of the acetabular component, which was grossly loose.  We continued to carefully dissect around the acetabular component.  The liner was removed with the liner removal till, and then the central occluder was removed and the shell itself with most of the cement still on it was removed in one piece.  We then stripped pseudocapsule from inside of the acetabulum and found a rather large cavitary defect.  We then  sequentially reamed up to a 59 mm hemispherical reamer, obtaining good coverage on the anterior and posterior columns.  Posterior and superior there was some deficiency for about 5-6 mm, but this was acceptable.  We then took a 40 cc vial of morcellized allograft and reverse-reamed it into the cavitary defect, filling the defect.  A 60 mm Sector cup was then selected and loaded and hammered into the acetabulum, obtaining excellent fit, and then three supplemental dome screws were applied again  with an excellent bite.  A trial 10 degree liner was placed in the shell posterior and superior.  Another NK +5 femoral ball was placed, the hip reduced, flexion to 90, and internal rotation of 50, was noted to be stable. She could not be dislocated anteriorly.  The trial components were then removed, and a 10 degree polyethylene Crossfire liner was hammered into place with the index marked posterior and superior and a +5 cobalt chrome ball hammered into the femoral component.  The hip was again reduced, stability noted to be excellent, and once again the wound was water-picd clean.  The pseudocapsule was repaired back to the intertrochanteric crest through drill holes using the #2 Ethibond suture.   Minimal bleeding was noted and no drain applied.  At this point the iliotibial band was closed with running #1 Vicryl suture, the subcutaneous tissue with 0 and 2-0 undyed Vicryl suture, and the skin with running interlocking 3-0 nylon suture.  A dressing of Xeroform, 4 x 4 dressing sponges, and Hypafix tape applied, the patient rolled supine, abduction pillow placed, and taken to the recovery room without difficulty. DD:  09/02/00 TD:  09/03/00 Job: [id number]

## 2010-08-06 ENCOUNTER — Telehealth: Payer: Self-pay | Admitting: Internal Medicine

## 2010-08-06 NOTE — Telephone Encounter (Signed)
OK as far as safety but no clinical trials available to prove the efficacy

## 2010-08-06 NOTE — Telephone Encounter (Signed)
Spoke with pt- informed of dr. kwiatkowski's instructions 

## 2010-08-06 NOTE — Telephone Encounter (Signed)
Pts spouse called and said that he had gotten his wife some Prevagen for improvement of brain function. This is an otc med and has, Apoaequorin 10mg  in it. Pts spouse is wondering if it is ok to give this to pt?

## 2010-11-07 LAB — COMPREHENSIVE METABOLIC PANEL
ALT: 17
AST: 24
Alkaline Phosphatase: 50
CO2: 32
Chloride: 104
Creatinine, Ser: 0.67
GFR calc Af Amer: >60
GFR calc non Af Amer: >60
Potassium: 3.8
Total Bilirubin: 0.7

## 2010-11-07 LAB — CBC
HCT: 38.7
Hemoglobin: 11.2 — ABNORMAL LOW
Hemoglobin: 13.4
MCHC: 34.7
MCV: 95.6
Platelets: 216
Platelets: 266
RDW: 12.9
RDW: 13.6
WBC: 7.9

## 2011-01-13 ENCOUNTER — Encounter: Payer: Self-pay | Admitting: Internal Medicine

## 2011-01-13 ENCOUNTER — Ambulatory Visit (INDEPENDENT_AMBULATORY_CARE_PROVIDER_SITE_OTHER): Payer: Medicare Other | Admitting: Internal Medicine

## 2011-01-13 VITALS — BP 120/80 | HR 70 | Temp 98.0°F | Resp 16 | Ht 58.5 in | Wt 135.0 lb

## 2011-01-13 DIAGNOSIS — R413 Other amnesia: Secondary | ICD-10-CM

## 2011-01-13 DIAGNOSIS — G3184 Mild cognitive impairment, so stated: Secondary | ICD-10-CM

## 2011-01-13 DIAGNOSIS — Z0000 Encounter for general adult medical examination without abnormal findings: Secondary | ICD-10-CM

## 2011-01-13 DIAGNOSIS — M1990 Unspecified osteoarthritis, unspecified site: Secondary | ICD-10-CM

## 2011-01-13 DIAGNOSIS — M810 Age-related osteoporosis without current pathological fracture: Secondary | ICD-10-CM

## 2011-01-13 LAB — CBC WITH DIFFERENTIAL/PLATELET
Basophils Absolute: 0 10*3/uL (ref 0.0–0.1)
Eosinophils Relative: 1.9 % (ref 0.0–5.0)
HCT: 41.8 % (ref 36.0–46.0)
Hemoglobin: 14.3 g/dL (ref 12.0–15.0)
Lymphocytes Relative: 25.4 % (ref 12.0–46.0)
Lymphs Abs: 2.1 10*3/uL (ref 0.7–4.0)
Monocytes Relative: 7.1 % (ref 3.0–12.0)
Neutro Abs: 5.5 10*3/uL (ref 1.4–7.7)
RDW: 13.7 % (ref 11.5–14.6)
WBC: 8.4 10*3/uL (ref 4.5–10.5)

## 2011-01-13 LAB — LDL CHOLESTEROL, DIRECT: Direct LDL: 115.3 mg/dL

## 2011-01-13 LAB — LIPID PANEL
Cholesterol: 215 mg/dL — ABNORMAL HIGH (ref 0–200)
Triglycerides: 115 mg/dL (ref 0.0–149.0)

## 2011-01-13 LAB — COMPREHENSIVE METABOLIC PANEL
BUN: 21 mg/dL (ref 6–23)
CO2: 27 mEq/L (ref 19–32)
Calcium: 9.2 mg/dL (ref 8.4–10.5)
Chloride: 105 mEq/L (ref 96–112)
Creatinine, Ser: 0.8 mg/dL (ref 0.4–1.2)
GFR: 76.55 mL/min (ref >60.00)
Glucose, Bld: 74 mg/dL (ref 70–99)
Total Bilirubin: 0.7 mg/dL (ref 0.3–1.2)

## 2011-01-13 LAB — TSH: TSH: 1.96 u[IU]/mL (ref 0.35–5.50)

## 2011-01-13 MED ORDER — NABUMETONE 500 MG PO TABS: 500.0000 mg | ORAL_TABLET | Freq: Every day | ORAL | Status: DC

## 2011-01-13 MED ORDER — DONEPEZIL HCL 5 MG PO TABS: 5.0000 mg | ORAL_TABLET | Freq: Every day | ORAL | Status: DC

## 2011-01-13 NOTE — Progress Notes (Signed)
Subjective:    Patient ID: Hayley Cummings, female    DOB: 25-Dec-1922, 77 y.o.   MRN: [id number]  HPI History of Present Illness:   77  year-old patient who is seen today for a wellness exam.   She has a history of osteoarthritis as well as osteoporosis. She has been treated with Fosamax in the past she has had a remote splenectomy and also has a history of mild cognitive impairment. They're quite well today without concerns or complaints  Here for Medicare AWV:    1. Risk factors based on Past M, S, F history: cardiovascular risk factors include age  64. Physical Activities: fairly active. Mild exercise limitations due to arthritis  3. Depression/mood: history depression, or mood disorder  4. Hearing: no major deficits  5. ADL's: independent in all aspects of daily living  6. Fall Risk: moderate due to age  51. Home Safety: no problems identified  8. Height, weight, &visual acuity:height and weight stable. No difficulty with visual acuity appeared  9. Counseling: calcium and vitamin D supplementation, and encouraged  10. Labs ordered based on risk factors: laboratory profile will be reviewed  11. Referral Coordination- none appropriate at this time  12. Care Plan- heart healthy diet more regular exercise. Encouraged  13. Cognitive Assessment- alert and oriented, with normal affect. History mild cognitive dysfunction   Allergies:    Past History:  Past Medical History:  Reviewed history from 05/31/2008 and no changes required.  Colonic polyps, hx of  Osteoporosis  Osteoarthritis  insomnia  cognitive impairment   Past Surgical History:  Reviewed history from 01/04/2008 and no changes required.  Colonoscopy 2007  D/C  Splenectomy 66  Hysterectomy September 2008  status post right hip, revision 2002  gravida 5, para 4, abortus one   Family History:  Reviewed history from 01/08/2009 and no changes required.  Family History Seizures  father died age 35, history of seizure  disorder  mother accidental death at age 30  one brother one sister in good health other than osteoarthritis   Social History:  Reviewed history from 11/25/2006 and no changes required.  Retired  Married  Never Smoked      Review of Systems  Constitutional: Negative for fever, appetite change, fatigue and unexpected weight change.  HENT: Negative for hearing loss, ear pain, nosebleeds, congestion, sore throat, mouth sores, trouble swallowing, neck stiffness, dental problem, voice change, sinus pressure and tinnitus.   Eyes: Negative for photophobia, pain, redness and visual disturbance.  Respiratory: Negative for cough, chest tightness and shortness of breath.   Cardiovascular: Negative for chest pain, palpitations and leg swelling.  Gastrointestinal: Negative for nausea, vomiting, abdominal pain, diarrhea, constipation, blood in stool, abdominal distention and rectal pain.  Genitourinary: Negative for dysuria, urgency, frequency, hematuria, flank pain, vaginal bleeding, vaginal discharge, difficulty urinating, genital sores, vaginal pain, menstrual problem and pelvic pain.  Musculoskeletal: Negative for back pain and arthralgias.  Skin: Negative for rash.  Neurological: Negative for dizziness, syncope, speech difficulty, weakness, light-headedness, numbness and headaches.  Hematological: Negative for adenopathy. Does not bruise/bleed easily.  Psychiatric/Behavioral: Positive for confusion. Negative for suicidal ideas, behavioral problems, self-injury, dysphoric mood and agitation. The patient is not nervous/anxious.        Objective:   Physical Exam  Constitutional: She is oriented to person, place, and time. She appears well-developed and well-nourished.  HENT:  Head: Normocephalic and atraumatic.  Right Ear: External ear normal.  Left Ear: External ear normal.  Mouth/Throat: Oropharynx is clear  and moist.  Eyes: Conjunctivae and EOM are normal.  Neck: Normal range of motion.  Neck supple. No JVD present. No thyromegaly present.  Cardiovascular: Normal rate, regular rhythm, normal heart sounds and intact distal pulses.   No murmur heard. Pulmonary/Chest: Effort normal and breath sounds normal. She has no wheezes. She has no rales.  Abdominal: Soft. Bowel sounds are normal. She exhibits no distension and no mass. There is no tenderness. There is no rebound and no guarding.       Scar left flank area  Genitourinary: Guaiac negative stool.  Musculoskeletal: Normal range of motion. She exhibits no edema and no tenderness.  Neurological: She is alert and oriented to person, place, and time. She has normal reflexes. No cranial nerve deficit. She exhibits normal muscle tone. Coordination normal.  Skin: Skin is warm and dry. No rash noted.  Psychiatric: She has a normal mood and affect. Her behavior is normal.          Assessment & Plan:   Preventive health examination Osteoporosis osteoarthritis Mild cognitive impairment Status post splenectomy

## 2011-01-13 NOTE — Patient Instructions (Signed)
Limit your sodium (Salt) intake    It is important that you exercise regularly, at least 20 minutes 3 to 4 times per week.  If you develop chest pain or shortness of breath seek  medical attention.  Return in one year for follow-up   

## 2011-03-03 ENCOUNTER — Other Ambulatory Visit: Payer: Self-pay | Admitting: Internal Medicine

## 2011-03-03 NOTE — Telephone Encounter (Signed)
Pt hus is requesting name brand aricept 5 mg #90 with 3 refills sent to [facility]. Also please mail copy of dec 2012 bloodwork to pt.

## 2011-03-04 MED ORDER — ARICEPT 5 MG PO TABS: 5.0000 mg | ORAL_TABLET | Freq: Every evening | ORAL | Status: DC | PRN

## 2011-03-04 NOTE — Telephone Encounter (Signed)
Rx sent and labs mailed.

## 2011-03-05 ENCOUNTER — Other Ambulatory Visit: Payer: Self-pay

## 2011-03-05 MED ORDER — DONEPEZIL HCL 5 MG PO TABS: 5.0000 mg | ORAL_TABLET | Freq: Every day | ORAL | Status: DC

## 2011-03-05 NOTE — Telephone Encounter (Signed)
Pt's husband called and stated that Brand name Aricept but [facility] stated the rx would be 936.00 dollars.  Pt would like generic rx sent to [facility].  Rx sent.

## 2011-07-22 ENCOUNTER — Telehealth: Payer: Self-pay | Admitting: Internal Medicine

## 2011-07-22 NOTE — Telephone Encounter (Signed)
Caller: Joseph/Spouse; PCP: Eleonore Chiquito; CB#: [phone]; ; ; Call regarding Rash/Hives; she has had itching to left arm and leg x approx 2 months and asking if it can be from her medication, started Tramadol past 2 months as well  for knee pain 2-3 times per day,using anti-itch cream past week for itch, not sure of name,he does not see any skin rash, denies emergent s/s per Rash Guideline,see in 72 hr disposition obtained due to itching that has not responded to home care,Appointment made for 07/24/11 at 11:15 with Dr Amador Cunas per husband request, callback perimeters gone over

## 2011-07-24 ENCOUNTER — Ambulatory Visit (INDEPENDENT_AMBULATORY_CARE_PROVIDER_SITE_OTHER): Payer: Medicare Other | Admitting: Internal Medicine

## 2011-07-24 ENCOUNTER — Encounter: Payer: Self-pay | Admitting: Internal Medicine

## 2011-07-24 VITALS — BP 126/84 | Temp 98.2°F | Wt 140.0 lb

## 2011-07-24 DIAGNOSIS — G3184 Mild cognitive impairment, so stated: Secondary | ICD-10-CM

## 2011-07-24 DIAGNOSIS — L309 Dermatitis, unspecified: Secondary | ICD-10-CM

## 2011-07-24 DIAGNOSIS — L259 Unspecified contact dermatitis, unspecified cause: Secondary | ICD-10-CM

## 2011-07-24 MED ORDER — TRIAMCINOLONE ACETONIDE 0.1 % EX CREA: TOPICAL_CREAM | Freq: Two times a day (BID) | CUTANEOUS | Status: AC

## 2011-07-24 MED ORDER — TRIAMCINOLONE ACETONIDE 0.1 % EX CREA: TOPICAL_CREAM | Freq: Two times a day (BID) | CUTANEOUS | Status: DC

## 2011-07-24 NOTE — Patient Instructions (Addendum)
Apply moisturizing cream  twice daily  Call or return to clinic prn if these symptoms worsen or fail to improve as anticipated.  Use Dove soap rather than [employer]

## 2011-07-24 NOTE — Progress Notes (Signed)
  Subjective:    Patient ID: Hayley Cummings, female    DOB: 1922/04/07, 78 y.o.   MRN: [id number]  HPI  78 year old patient who presents with a chief complaint of a rash involving her lower extremities. This has been present for 3 or 4 weeks.. She has a history of memory impairment and remains on Aricept. No new medications. She has switched to [place] soap over the past month    Review of Systems  Skin: Positive for rash.       Objective:   Physical Exam  Constitutional: She appears well-developed and well-nourished. No distress.  Skin: Rash noted.       Scattered areas of erythema excoriations and dry flaky skin involving both anterior lower legs the left greater than the right          Assessment & Plan:   Dermatitis. Will switch to [employer] and use a moisturizing cream twice daily. We'll treat with short-term triamcinolone

## 2011-10-14 ENCOUNTER — Other Ambulatory Visit: Payer: Self-pay | Admitting: Internal Medicine

## 2011-10-14 MED ORDER — NABUMETONE 500 MG PO TABS: 500.0000 mg | ORAL_TABLET | Freq: Every day | ORAL | Status: DC

## 2011-10-14 MED ORDER — TEMAZEPAM 15 MG PO CAPS: 15.0000 mg | ORAL_CAPSULE | Freq: Every evening | ORAL | Status: DC | PRN

## 2011-10-14 NOTE — Telephone Encounter (Signed)
Pts spouse called and is needing to get a partial refill of nabumetone (RELAFEN) 500 MG tablet. To last until 11/20/11. Pt only has 6 pills left.  [facility] [address].   Also pt needs to get a refill of temazepam (RESTORIL) 15 MG capsule to [facility] mail order pharmacy.

## 2011-10-14 NOTE — Telephone Encounter (Signed)
Spoke with husband - rx's taken care of. KIK

## 2011-11-24 ENCOUNTER — Other Ambulatory Visit: Payer: Self-pay | Admitting: Internal Medicine

## 2011-11-24 MED ORDER — NABUMETONE 500 MG PO TABS: 500.0000 mg | ORAL_TABLET | Freq: Every day | ORAL | Status: DC

## 2011-11-24 NOTE — Telephone Encounter (Signed)
Pts spouse called and said that pt needs refill nabumetone (RELAFEN) 500 MG tablet to [facility] mail [facility]. Pls call.

## 2011-12-01 ENCOUNTER — Other Ambulatory Visit: Payer: Self-pay

## 2011-12-01 MED ORDER — TEMAZEPAM 15 MG PO CAPS: 15.0000 mg | ORAL_CAPSULE | Freq: Every evening | ORAL | Status: DC | PRN

## 2011-12-01 NOTE — Telephone Encounter (Signed)
[facility]

## 2012-01-14 ENCOUNTER — Encounter: Payer: PRIVATE HEALTH INSURANCE | Admitting: Internal Medicine

## 2012-01-20 ENCOUNTER — Encounter: Payer: Self-pay | Admitting: Internal Medicine

## 2012-01-20 ENCOUNTER — Ambulatory Visit (INDEPENDENT_AMBULATORY_CARE_PROVIDER_SITE_OTHER): Payer: Medicare Other | Admitting: Internal Medicine

## 2012-01-20 VITALS — BP 150/90 | HR 76 | Temp 97.5°F | Resp 18 | Ht 59.0 in | Wt 141.0 lb

## 2012-01-20 DIAGNOSIS — G3184 Mild cognitive impairment, so stated: Secondary | ICD-10-CM

## 2012-01-20 DIAGNOSIS — Z0000 Encounter for general adult medical examination without abnormal findings: Secondary | ICD-10-CM

## 2012-01-20 DIAGNOSIS — R413 Other amnesia: Secondary | ICD-10-CM

## 2012-01-20 DIAGNOSIS — Z23 Encounter for immunization: Secondary | ICD-10-CM

## 2012-01-20 DIAGNOSIS — I10 Essential (primary) hypertension: Secondary | ICD-10-CM

## 2012-01-20 DIAGNOSIS — E785 Hyperlipidemia, unspecified: Secondary | ICD-10-CM

## 2012-01-20 DIAGNOSIS — M1990 Unspecified osteoarthritis, unspecified site: Secondary | ICD-10-CM

## 2012-01-20 DIAGNOSIS — Z86010 Personal history of colonic polyps: Secondary | ICD-10-CM

## 2012-01-20 DIAGNOSIS — Z299 Encounter for prophylactic measures, unspecified: Secondary | ICD-10-CM

## 2012-01-20 MED ORDER — TRAMADOL HCL 50 MG PO TABS: 50.0000 mg | ORAL_TABLET | ORAL | Status: DC

## 2012-01-20 MED ORDER — HYDROCHLOROTHIAZIDE 25 MG PO TABS: 25.0000 mg | ORAL_TABLET | Freq: Every day | ORAL | Status: DC

## 2012-01-20 MED ORDER — DONEPEZIL HCL 5 MG PO TABS: 5.0000 mg | ORAL_TABLET | Freq: Every day | ORAL | Status: DC

## 2012-01-20 MED ORDER — NABUMETONE 500 MG PO TABS: 500.0000 mg | ORAL_TABLET | Freq: Every day | ORAL | Status: DC

## 2012-01-20 NOTE — Progress Notes (Signed)
Patient ID: Hayley Cummings, female   DOB: Dec 08, 1922, 78 y.o.   MRN: [id number]  Subjective:    Patient ID: Hayley Cummings, female    DOB: [birth year], 78 y.o.   MRN: [id number]  Hypertension Pertinent negatives include no chest pain, headaches, palpitations or shortness of breath.   History of Present Illness:   78  year-old patient who is seen today for a wellness exam.   She has a history of osteoarthritis as well as osteoporosis. She has been treated with Fosamax in the past she has had a remote splenectomy and also has a history of mild cognitive impairment. They're quite well today without concerns or complaints  Here for Medicare AWV:    1. Risk factors based on Past M, S, F history: cardiovascular risk factors include age  6. Physical Activities: fairly active. Mild exercise limitations due to arthritis  3. Depression/mood: history depression, or mood disorder  4. Hearing: no major deficits  5. ADL's: independent in all aspects of daily living  6. Fall Risk: moderate due to age  17. Home Safety: no problems identified  8. Height, weight, &visual acuity:height and weight stable. No difficulty with visual acuity appeared  9. Counseling: calcium and vitamin D supplementation, and encouraged  10. Labs ordered based on risk factors: laboratory profile will be reviewed  11. Referral Coordination- none appropriate at this time  12. Care Plan- heart healthy diet more regular exercise. Encouraged  13. Cognitive Assessment- alert and oriented, with normal affect. History mild cognitive dysfunction   Allergies:    Past History:  Past Medical History:  Reviewed history from 05/31/2008 and no changes required.  Colonic polyps, hx of  Osteoporosis  Osteoarthritis  insomnia  cognitive impairment   Past Surgical History:  Reviewed history from 01/04/2008 and no changes required.  Colonoscopy 2007  D/C  Splenectomy 66  Hysterectomy September 2008  status post right hip, revision  2002  gravida 5, para 4, abortus one   Family History:  Reviewed history from 01/08/2009 and no changes required.  Family History Seizures  father died age 31, history of seizure disorder  mother accidental death at age 30  one brother one sister in good health other than osteoarthritis   Social History:  Reviewed history from 11/25/2006 and no changes required.  Retired  Married  Never Smoked      Review of Systems  Constitutional: Negative for fever, appetite change, fatigue and unexpected weight change.  HENT: Negative for hearing loss, ear pain, nosebleeds, congestion, sore throat, mouth sores, trouble swallowing, neck stiffness, dental problem, voice change, sinus pressure and tinnitus.   Eyes: Negative for photophobia, pain, redness and visual disturbance.  Respiratory: Negative for cough, chest tightness and shortness of breath.   Cardiovascular: Negative for chest pain, palpitations and leg swelling.  Gastrointestinal: Negative for nausea, vomiting, abdominal pain, diarrhea, constipation, blood in stool, abdominal distention and rectal pain.  Genitourinary: Negative for dysuria, urgency, frequency, hematuria, flank pain, vaginal bleeding, vaginal discharge, difficulty urinating, genital sores, vaginal pain, menstrual problem and pelvic pain.  Musculoskeletal: Negative for back pain and arthralgias.  Skin: Negative for rash.  Neurological: Negative for dizziness, syncope, speech difficulty, weakness, light-headedness, numbness and headaches.  Hematological: Negative for adenopathy. Does not bruise/bleed easily.  Psychiatric/Behavioral: Positive for confusion. Negative for suicidal ideas, behavioral problems, self-injury, dysphoric mood and agitation. The patient is not nervous/anxious.        Objective:   Physical Exam  Constitutional: She is oriented to person, place,  and time. She appears well-developed and well-nourished.       Blood pressure 170/80  HENT:  Head:  Normocephalic and atraumatic.  Right Ear: External ear normal.  Left Ear: External ear normal.  Mouth/Throat: Oropharynx is clear and moist.  Eyes: Conjunctivae normal and EOM are normal.  Neck: Normal range of motion. Neck supple. No JVD present. No thyromegaly present.  Cardiovascular: Normal rate, regular rhythm and intact distal pulses.   Murmur heard.      Grade 2/6 systolic murmur loudest at the base  Pulmonary/Chest: Effort normal and breath sounds normal. She has no wheezes. She has no rales.  Abdominal: Soft. Bowel sounds are normal. She exhibits no distension and no mass. There is no tenderness. There is no rebound and no guarding.       Scar left flank area  Genitourinary: Guaiac negative stool.  Musculoskeletal: Normal range of motion. She exhibits no edema and no tenderness.  Neurological: She is alert and oriented to person, place, and time. She has normal reflexes. No cranial nerve deficit. She exhibits normal muscle tone. Coordination normal.  Skin: Skin is warm and dry. No rash noted.  Psychiatric: She has a normal mood and affect. Her behavior is normal.          Assessment & Plan:   Preventive health examination Osteoporosis osteoarthritis Mild cognitive impairment Status post splenectomy Systolic hypertension. We'll start hydrochlorothiazide 25 mg daily

## 2012-01-20 NOTE — Patient Instructions (Signed)
Limit your sodium (Salt) intake  Please check your blood pressure on a regular basis.  If it is consistently greater than 150/90, please make an office appointment.  Return in one month for follow-up  

## 2012-01-26 ENCOUNTER — Telehealth: Payer: Self-pay | Admitting: Internal Medicine

## 2012-01-26 NOTE — Telephone Encounter (Signed)
Patient's spouse called stating that he received a message from [facility] on his vm stating to have your physician contact us concerning your meds. Please assist.

## 2012-02-02 MED ORDER — TRAMADOL HCL 50 MG PO TABS: 50.0000 mg | ORAL_TABLET | Freq: Four times a day (QID) | ORAL | Status: DC | PRN

## 2012-02-02 NOTE — Telephone Encounter (Signed)
Called [facility] and spoke to pharmacist needed order for Tramadol, refill order given.

## 2012-02-17 ENCOUNTER — Ambulatory Visit (INDEPENDENT_AMBULATORY_CARE_PROVIDER_SITE_OTHER): Payer: Medicare Other | Admitting: Internal Medicine

## 2012-02-17 ENCOUNTER — Encounter: Payer: Self-pay | Admitting: Internal Medicine

## 2012-02-17 VITALS — BP 126/70 | HR 68 | Temp 97.7°F | Resp 18 | Wt 138.0 lb

## 2012-02-17 DIAGNOSIS — I10 Essential (primary) hypertension: Secondary | ICD-10-CM

## 2012-02-17 DIAGNOSIS — Z860100 Personal history of colon polyps, unspecified: Secondary | ICD-10-CM

## 2012-02-17 DIAGNOSIS — M810 Age-related osteoporosis without current pathological fracture: Secondary | ICD-10-CM

## 2012-02-17 DIAGNOSIS — M1990 Unspecified osteoarthritis, unspecified site: Secondary | ICD-10-CM

## 2012-02-17 LAB — CBC WITH DIFFERENTIAL/PLATELET
Basophils Relative: 0.2 % (ref 0.0–3.0)
Eosinophils Relative: 1.7 % (ref 0.0–5.0)
HCT: 41 % (ref 36.0–46.0)
Hemoglobin: 13.7 g/dL (ref 12.0–15.0)
Lymphs Abs: 2 10*3/uL (ref 0.7–4.0)
MCV: 95.5 fl (ref 78.0–100.0)
Monocytes Absolute: 0.8 10*3/uL (ref 0.1–1.0)
Monocytes Relative: 8.2 % (ref 3.0–12.0)
Neutro Abs: 6.6 10*3/uL (ref 1.4–7.7)
RBC: 4.29 Mil/uL (ref 3.87–5.11)
WBC: 9.6 10*3/uL (ref 4.5–10.5)

## 2012-02-17 LAB — COMPREHENSIVE METABOLIC PANEL
Alkaline Phosphatase: 65 U/L (ref 39–117)
CO2: 32 mEq/L (ref 19–32)
Creatinine, Ser: 1.4 mg/dL — ABNORMAL HIGH (ref 0.4–1.2)
GFR: 39.01 mL/min — ABNORMAL LOW (ref >60.00)
Glucose, Bld: 94 mg/dL (ref 70–99)
Sodium: 136 mEq/L (ref 135–145)
Total Bilirubin: 0.8 mg/dL (ref 0.3–1.2)
Total Protein: 7.5 g/dL (ref 6.0–8.3)

## 2012-02-17 LAB — LIPID PANEL
Cholesterol: 248 mg/dL — ABNORMAL HIGH (ref 0–200)
HDL: 63.3 mg/dL (ref >39.00)
Total CHOL/HDL Ratio: 4
Triglycerides: 168 mg/dL — ABNORMAL HIGH (ref 0.0–149.0)

## 2012-02-17 LAB — LDL CHOLESTEROL, DIRECT: Direct LDL: 148.7 mg/dL

## 2012-02-17 MED ORDER — TEMAZEPAM 15 MG PO CAPS: 15.0000 mg | ORAL_CAPSULE | Freq: Every evening | ORAL | Status: DC | PRN

## 2012-02-17 NOTE — Patient Instructions (Signed)
Limit your sodium (Salt) intake  Please check your blood pressure on a regular basis.  If it is consistently greater than 150/90, please make an office appointment.  Return in 6 months for follow-up   

## 2012-02-17 NOTE — Progress Notes (Signed)
Subjective:    Patient ID: Hayley Cummings, female    DOB: 02/19/1922, 79 y.o.   MRN: [id number]  HPI  79 year old patient who is seen today for followup of hypertension. She was placed on hydrochlorothiazide last month and is seen today for followup of her blood pressure. She has tolerated this medication well. Last month she was seen for her annual exam but failed to have laboratory studies performed. We will do this today.  Past Medical History  Diagnosis Date  . COLONIC POLYPS, HX OF 11/25/2006  . Memory loss 05/31/2008  . MILD COGNITIVE IMPAIRMENT SO STATED 05/31/2008  . OSTEOARTHRITIS 11/30/2006  . OSTEOPOROSIS 11/25/2006    History   Social History  . Marital Status: Married    Spouse Name: N/A    Number of Children: N/A  . Years of Education: N/A   Occupational History  . Not on file.   Social History Main Topics  . Smoking status: Never Smoker   . Smokeless tobacco: Never Used  . Alcohol Use: No  . Drug Use: No  . Sexually Active: Not on file   Other Topics Concern  . Not on file   Social History Narrative  . No narrative on file    Past Surgical History  Procedure Date  . Abdominal hysterectomy   . Total hip revision   . Dilation and curettage of uterus     No family history on file.  No Known Allergies  Current Outpatient Prescriptions on File Prior to Visit  Medication Sig Dispense Refill  . co-enzyme Q-10 30 MG capsule Take 30 mg by mouth daily.      Marland Kitchen donepezil (ARICEPT) 5 MG tablet Take 1 tablet (5 mg total) by mouth at bedtime.  90 tablet  6  . hydrochlorothiazide (HYDRODIURIL) 25 MG tablet Take 1 tablet (25 mg total) by mouth daily.  90 tablet  3  . Multiple Vitamins-Minerals (PRESERVISION AREDS) CAPS Take by mouth.      . nabumetone (RELAFEN) 500 MG tablet Take 1 tablet (500 mg total) by mouth daily.  90 tablet  1  . temazepam (RESTORIL) 15 MG capsule Take 1 capsule (15 mg total) by mouth at bedtime as needed for sleep.  90 capsule  0  .  traMADol (ULTRAM) 50 MG tablet Take 1 tablet (50 mg total) by mouth every 6 (six) hours as needed for pain.  90 tablet  3  . triamcinolone cream (KENALOG) 0.1 % Apply topically 2 (two) times daily.  45 g  1    BP 126/70  Pulse 68  Temp 97.7 F (36.5 C) (Oral)  Resp 18  Wt 138 lb (62.596 kg)  SpO2 98%       Review of Systems  Constitutional: Negative.   HENT: Negative for hearing loss, congestion, sore throat, rhinorrhea, dental problem, sinus pressure and tinnitus.   Eyes: Negative for pain, discharge and visual disturbance.  Respiratory: Negative for cough and shortness of breath.   Cardiovascular: Negative for chest pain, palpitations and leg swelling.  Gastrointestinal: Negative for nausea, vomiting, abdominal pain, diarrhea, constipation, blood in stool and abdominal distention.  Genitourinary: Negative for dysuria, urgency, frequency, hematuria, flank pain, vaginal bleeding, vaginal discharge, difficulty urinating, vaginal pain and pelvic pain.  Musculoskeletal: Negative for joint swelling, arthralgias and gait problem.  Skin: Negative for rash.  Neurological: Negative for dizziness, syncope, speech difficulty, weakness, numbness and headaches.  Hematological: Negative for adenopathy.  Psychiatric/Behavioral: Negative for behavioral problems, dysphoric mood and agitation. The patient  is not nervous/anxious.        Objective:   Physical Exam  Constitutional: She appears well-nourished. No distress.       Blood pressure 126/70  Neck: Normal range of motion.  Cardiovascular: Normal rate, regular rhythm and normal heart sounds.   Pulmonary/Chest: Effort normal and breath sounds normal.  Musculoskeletal: She exhibits no edema.          Assessment & Plan:  Hypertension well controlled Osteoarthritis Mild dementia   Recheck 6 months

## 2012-05-24 ENCOUNTER — Telehealth: Payer: Self-pay | Admitting: Internal Medicine

## 2012-05-24 NOTE — Telephone Encounter (Signed)
Pt aware.

## 2012-05-24 NOTE — Telephone Encounter (Signed)
Pt would like to know if you do the Prolow Therapy? If not, do you know any MD that does? This is a Dr Amador Cunas patient, but saw you name in the phone book as a DO and called you. Pls advise.

## 2012-05-24 NOTE — Telephone Encounter (Signed)
I do not perform prolotherapy.

## 2012-06-08 ENCOUNTER — Telehealth: Payer: Self-pay | Admitting: Internal Medicine

## 2012-06-08 NOTE — Telephone Encounter (Signed)
Spoke to Joe pt's husband told him okay to give wife Naproxen per Dr. Amador Cunas. Joe verbalized understanding.

## 2012-06-08 NOTE — Telephone Encounter (Signed)
Ok to use naproxen

## 2012-06-08 NOTE — Telephone Encounter (Signed)
Patient Information:  Caller Name: Gabriel Rung  Phone: [phone]  Patient: Hayley Cummings  Gender: Female  DOB: Nov 05, 1922  Age: 79 Years  PCP: Eleonore Chiquito [facility])  Office Follow Up:  Does the office need to follow up with this patient?: Yes  Instructions For The Office: Husband would like to give Naproxen to wife. Concerning with drug interaction with other medication. Seeking Dr.K. Guidance.  RN Note:  PLEASE CONTACT HUSBAND CONCERNING MEDICAITON.  CAN PATIENT TAKE NAPROXEN WITH CURRENT MEDICATION LIST. Encouraged him to discuss with pharmacist as well for drug fromulary. Request Dr.K. input and guidance on Naproxen.  Symptoms  Reason For Call & Symptoms: Left side and back neck pain for several days. He has been giving her Tramadol.  He reports last night he gave her naproxen.  Is is safe to give her Naproxen with her other medications she is currently taking?  Husband denies injury .  Declines Triage. MEDICATION QUESTION ONLY  Reviewed Health History In EMR: Yes  Reviewed Medications In EMR: Yes  Reviewed Allergies In EMR: Yes  Reviewed Surgeries / Procedures: Yes  Date of Onset of Symptoms: 06/04/2012  Treatments Tried: Tramadol and Naproxen  Treatments Tried Worked: No  Guideline(s) Used:  No Protocol Available - Information Only  Disposition Per Guideline:   Discuss with PCP and Callback by Nurse Today  Reason For Disposition Reached:   Nursing judgment  Advice Given:  Call Back If:  You become worse.  Patient Will Follow Care Advice:  YES

## 2012-07-20 ENCOUNTER — Ambulatory Visit (INDEPENDENT_AMBULATORY_CARE_PROVIDER_SITE_OTHER): Payer: Medicare Other | Admitting: Internal Medicine

## 2012-07-20 DIAGNOSIS — M1990 Unspecified osteoarthritis, unspecified site: Secondary | ICD-10-CM

## 2012-07-23 ENCOUNTER — Telehealth: Payer: Self-pay | Admitting: Internal Medicine

## 2012-07-23 NOTE — Telephone Encounter (Signed)
Patient Information:  Caller Name: Hayley Cummings  Phone: [phone]  Patient: Hayley Cummings  Gender: Female  DOB: 1922-06-28  Age: 79 Years  PCP: Eleonore Chiquito [facility])  Office Follow Up:  Does the office need to follow up with this patient?: No  Instructions For The Office: N/A  RN Note:  Had follow up appt scheduled a week ago, but states spouse fell and broke his hip and could not keep appt.  States new appt scheduled 07/26/12; states she has some memory issues, and family wants Dr. Amador Cunas to know that she has been c/o feeling cold all the time, and wants to know if this is a side effect of the medication.  States family also feels she will Eating well; no c/o dizziness or fainting spells.  States blood pressures have been in the normal range when checked, but has  no way to check it currently.  Per hypertension protocol, emergent symptoms denied; to keep appt 07/26/12.  Family will attend appt with the patient.  krs/can  Symptoms  Reason For Call & Symptoms: blood pressure medication question  Reviewed Health History In EMR: Yes  Reviewed Medications In EMR: Yes  Reviewed Allergies In EMR: Yes  Reviewed Surgeries / Procedures: Yes  Date of Onset of Symptoms: Unknown  Guideline(s) Used:  High Blood Pressure  Disposition Per Guideline:   Home Care  Reason For Disposition Reached:   BP < 120/80  Advice Given:  N/A  Patient Will Follow Care Advice:  YES

## 2012-07-26 ENCOUNTER — Encounter: Payer: Self-pay | Admitting: Internal Medicine

## 2012-07-26 ENCOUNTER — Ambulatory Visit (INDEPENDENT_AMBULATORY_CARE_PROVIDER_SITE_OTHER): Payer: Medicare Other | Admitting: Internal Medicine

## 2012-07-26 VITALS — BP 120/70 | HR 67 | Temp 98.1°F | Resp 18 | Wt 136.0 lb

## 2012-07-26 DIAGNOSIS — M1990 Unspecified osteoarthritis, unspecified site: Secondary | ICD-10-CM

## 2012-07-26 DIAGNOSIS — I10 Essential (primary) hypertension: Secondary | ICD-10-CM

## 2012-07-26 DIAGNOSIS — G3184 Mild cognitive impairment, so stated: Secondary | ICD-10-CM

## 2012-07-26 LAB — BASIC METABOLIC PANEL
BUN: 34 mg/dL — ABNORMAL HIGH (ref 6–23)
CO2: 27 mEq/L (ref 19–32)
Calcium: 9.8 mg/dL (ref 8.4–10.5)
Chloride: 96 mEq/L (ref 96–112)
Creatinine, Ser: 1.3 mg/dL — ABNORMAL HIGH (ref 0.4–1.2)
Glucose, Bld: 81 mg/dL (ref 70–99)

## 2012-07-26 MED ORDER — NABUMETONE 500 MG PO TABS: 500.0000 mg | ORAL_TABLET | Freq: Every day | ORAL | Status: DC

## 2012-07-26 NOTE — Patient Instructions (Addendum)
Limit your sodium (Salt) intake  Return in 6 months for follow-up  

## 2012-07-26 NOTE — Progress Notes (Signed)
  Subjective:    Patient ID: Hayley Cummings, female    DOB: [birth year], 79 y.o.   MRN: [id number]  HPI   79 year old patient who is seen today for her biannual followup. She is on diuretic therapy for systolic hypertension. She is doing quite well today. She has a history of osteoarthritis and some gait instability. She is requesting a prescription for a stair lift as well as a wheelchair. She has a history also of mild cognitive impairment and is accompanied today by a daughter. No falls Laboratory studies from January reviewed which revealed a slight elevation in creatinine. Medical regimen does include Relafen when necessary  Call or return to clinic prn if these symptoms worsen or fail to improve as anticipated.     Review of Systems  Constitutional: Negative.   HENT: Negative for hearing loss, congestion, sore throat, rhinorrhea, dental problem, sinus pressure and tinnitus.   Eyes: Negative for pain, discharge and visual disturbance.  Respiratory: Negative for cough and shortness of breath.   Cardiovascular: Negative for chest pain, palpitations and leg swelling.  Gastrointestinal: Negative for nausea, vomiting, abdominal pain, diarrhea, constipation, blood in stool and abdominal distention.  Genitourinary: Negative for dysuria, urgency, frequency, hematuria, flank pain, vaginal bleeding, vaginal discharge, difficulty urinating, vaginal pain and pelvic pain.  Musculoskeletal: Positive for gait problem. Negative for joint swelling and arthralgias.  Skin: Negative for rash.  Neurological: Negative for dizziness, syncope, speech difficulty, weakness, numbness and headaches.  Hematological: Negative for adenopathy.  Psychiatric/Behavioral: Positive for confusion. Negative for behavioral problems, dysphoric mood and agitation. The patient is not nervous/anxious.        Objective:   Physical Exam  Constitutional: She is oriented to person, place, and time. She appears well-developed  and well-nourished.  HENT:  Head: Normocephalic.  Right Ear: External ear normal.  Left Ear: External ear normal.  Mouth/Throat: Oropharynx is clear and moist.  Eyes: Conjunctivae and EOM are normal. Pupils are equal, round, and reactive to light.  Neck: Normal range of motion. Neck supple. No thyromegaly present.  Cardiovascular: Normal rate, regular rhythm, normal heart sounds and intact distal pulses.   Pulmonary/Chest: Effort normal and breath sounds normal.  Abdominal: Soft. Bowel sounds are normal. She exhibits no mass. There is no tenderness.  Musculoskeletal: Normal range of motion.  Very unsteady Requires assistance to transfer from a chair to the examining table Uses a cane  Lymphadenopathy:    She has no cervical adenopathy.  Neurological: She is alert and oriented to person, place, and time.  Skin: Skin is warm and dry. No rash noted.  Psychiatric: She has a normal mood and affect. Her behavior is normal.          Assessment & Plan:   Hypertension well controlled Gait abnormality Osteoarthritis Elevated serum creatinine. We'll recheck today if still elevated will need to discontinue anti-inflammatory drug therapy and consider alternate blood pressure medication  Continue home blood pressure monitoring Recheck 6 months if stable

## 2012-08-16 ENCOUNTER — Telehealth: Payer: Self-pay | Admitting: Internal Medicine

## 2012-08-16 NOTE — Telephone Encounter (Signed)
When pt was here on 6/30 she told Dr Kirtland Bouchard that she'd been having a "salty mouth" - it tasted like salt. Husbands states that at the time, Dr Kirtland Bouchard was not concerned. However, it is persisting, and pt wants to get rid of it. She wants to know what to do.

## 2012-08-17 NOTE — Telephone Encounter (Signed)
Spoke to pt's husband said he discontinued pt's HCTZ this morning due to "salty mouth" taste and it has helped wanted Dr. Amador Cunas to be aware. Loma Messing I will send message to Dr. Cleda Mccreedy verbalized understanding.

## 2012-08-17 NOTE — Telephone Encounter (Signed)
Okay to observe off hydrochlorothiazide

## 2012-08-18 NOTE — Telephone Encounter (Signed)
Spoke to pt's husband told him okay off HCTZ but monitor per Dr. Amador Cunas. Hayley Cummings verbalized understanding.

## 2012-08-20 ENCOUNTER — Telehealth: Payer: Self-pay | Admitting: Internal Medicine

## 2012-08-20 MED ORDER — FLUTICASONE PROPIONATE 50 MCG/ACT NA SUSP: 2.0000 | Freq: Every day | NASAL | Status: DC

## 2012-08-20 NOTE — Telephone Encounter (Signed)
Spoke to pt's husband Jomarie Longs told him [facility]. Jomarie Longs verbalized understanding.

## 2012-08-20 NOTE — Telephone Encounter (Signed)
Pt call flonase into local phar [facility] [address] [phone]. Pt call pt husband once med has been call into phar

## 2012-08-20 NOTE — Telephone Encounter (Signed)
Please call pt hus on cell

## 2012-08-30 ENCOUNTER — Telehealth: Payer: Self-pay | Admitting: Internal Medicine

## 2012-08-30 NOTE — Telephone Encounter (Signed)
Patient Information:  Caller Name: Jomarie Longs  Phone: [phone]  Patient: Hayley, Cummings  Gender: Female  DOB: 01-20-1923  Age: 79 Years  PCP: Eleonore Chiquito [facility])  Office Follow Up:  Does the office need to follow up with this patient?: No  Instructions For The Office: N/A   Symptoms  Reason For Call & Symptoms: Calling about Salty taste in Mouth for past 3 weeks  - nose drops Fluticasone prescribed helps some but taste comes back.  Taken off HCTZ and wondering if she needs to go back on it now. Requesting that she be tested for Zinc or Vit B 12 deficiency and if Kidneys are ok.  Reviewed Health History In EMR: Yes  Reviewed Medications In EMR: Yes  Reviewed Allergies In EMR: Yes  Reviewed Surgeries / Procedures: Yes  Date of Onset of Symptoms: 08/09/2012  Treatments Tried: stopping HCTZ, Nose drops  Treatments Tried Worked: No  Guideline(s) Used:  No Protocol Available - Sick Adult  Disposition Per Guideline:   See Within 3 Days in Office  Reason For Disposition Reached:   Nursing judgment  Advice Given:  Call Back If:  New symptoms develop  You become worse.  Patient Refused Recommendation:  Patient Will Make Own Appointment  Appointment scheduled on 09/01/12 - per patient request.

## 2012-08-30 NOTE — Telephone Encounter (Signed)
Attempted to reach spouse regarding "questions about salty mouth."  On callback, unable to reach caller at number given; message left on identified voicemail to contact office for assistance.  krs/can

## 2012-09-01 ENCOUNTER — Encounter: Payer: Self-pay | Admitting: Internal Medicine

## 2012-09-01 ENCOUNTER — Ambulatory Visit (INDEPENDENT_AMBULATORY_CARE_PROVIDER_SITE_OTHER): Payer: Medicare Other | Admitting: Internal Medicine

## 2012-09-01 VITALS — BP 110/70 | HR 79 | Temp 98.5°F | Resp 18 | Wt 133.0 lb

## 2012-09-01 DIAGNOSIS — I10 Essential (primary) hypertension: Secondary | ICD-10-CM

## 2012-09-01 DIAGNOSIS — R413 Other amnesia: Secondary | ICD-10-CM

## 2012-09-01 DIAGNOSIS — R439 Unspecified disturbances of smell and taste: Secondary | ICD-10-CM

## 2012-09-01 DIAGNOSIS — R432 Parageusia: Secondary | ICD-10-CM

## 2012-09-01 NOTE — Progress Notes (Signed)
Subjective:    Patient ID: Hayley Cummings, female    DOB: August 26, 1922, 79 y.o.   MRN: [id number]  HPI  79 year old patient who has a history of memory impairment. She hasn't bothered by a salty taste in her mouth and her husband gave her an unsuccessful trial of hydrochlorothiazide which was not helpful. She discontinued Aricept a few days ago which seems to have made a difference. She has also been prescribed a mouth wash twice daily as well as fluticasone nasal spray and Claritin.  Past Medical History  Diagnosis Date  . COLONIC POLYPS, HX OF 11/25/2006  . Memory loss 05/31/2008  . MILD COGNITIVE IMPAIRMENT SO STATED 05/31/2008  . OSTEOARTHRITIS 11/30/2006  . OSTEOPOROSIS 11/25/2006    History   Social History  . Marital Status: Married    Spouse Name: N/A    Number of Children: N/A  . Years of Education: N/A   Occupational History  . Not on file.   Social History Main Topics  . Smoking status: Never Smoker   . Smokeless tobacco: Never Used  . Alcohol Use: No  . Drug Use: No  . Sexually Active: Not on file   Other Topics Concern  . Not on file   Social History Narrative  . No narrative on file    Past Surgical History  Procedure Laterality Date  . Abdominal hysterectomy    . Total hip revision    . Dilation and curettage of uterus      No family history on file.  No Known Allergies  Current Outpatient Prescriptions on File Prior to Visit  Medication Sig Dispense Refill  . co-enzyme Q-10 30 MG capsule Take 30 mg by mouth daily.      . fluticasone (FLONASE) 50 MCG/ACT nasal spray Place 2 sprays into the nose daily.  16 g  6  . hydrochlorothiazide (HYDRODIURIL) 25 MG tablet Take 1 tablet (25 mg total) by mouth daily.  90 tablet  3  . Multiple Vitamins-Minerals (PRESERVISION AREDS) CAPS Take by mouth.      . nabumetone (RELAFEN) 500 MG tablet Take 1 tablet (500 mg total) by mouth daily.  90 tablet  1  . temazepam (RESTORIL) 15 MG capsule Take 1 capsule (15 mg total)  by mouth at bedtime as needed for sleep.  90 capsule  1  . traMADol (ULTRAM) 50 MG tablet Take 1 tablet (50 mg total) by mouth every 6 (six) hours as needed for pain.  90 tablet  3  . donepezil (ARICEPT) 5 MG tablet Take 1 tablet (5 mg total) by mouth at bedtime.  90 tablet  6   No current facility-administered medications on file prior to visit.    BP 110/70  Pulse 79  Temp(Src) 98.5 F (36.9 C) (Oral)  Resp 18  Wt 133 lb (60.328 kg)  BMI 26.85 kg/m2  SpO2 97%       Review of Systems  Constitutional: Negative.   HENT: Negative for hearing loss, congestion, sore throat, rhinorrhea, dental problem, sinus pressure and tinnitus.   Eyes: Negative for pain, discharge and visual disturbance.  Respiratory: Negative for cough and shortness of breath.   Cardiovascular: Negative for chest pain, palpitations and leg swelling.  Gastrointestinal: Negative for nausea, vomiting, abdominal pain, diarrhea, constipation, blood in stool and abdominal distention.  Genitourinary: Negative for dysuria, urgency, frequency, hematuria, flank pain, vaginal bleeding, vaginal discharge, difficulty urinating, vaginal pain and pelvic pain.  Musculoskeletal: Negative for joint swelling, arthralgias and gait problem.  Skin:  Negative for rash.  Neurological: Negative for dizziness, syncope, speech difficulty, weakness, numbness and headaches.  Hematological: Negative for adenopathy.  Psychiatric/Behavioral: Positive for confusion. Negative for behavioral problems, dysphoric mood and agitation. The patient is not nervous/anxious.        Objective:   Physical Exam  Constitutional: She appears well-developed and well-nourished. No distress.  HENT:  Right Ear: External ear normal.  Left Ear: External ear normal.  Nose: Nose normal.  Mouth/Throat: Oropharynx is clear and moist.          Assessment & Plan:  Dysgeusia.  Seems to have improved after discontinuation of Aricept. Suggested a two-week trial  off Aricept and to rechallenge at that time Memory impairment. May need to consider alternate medication

## 2012-09-01 NOTE — Patient Instructions (Addendum)
Hold Aricept for 2 weeks and then restart medication-  notify if salty taste in the mouth reoccurs for consideration of alternate medication

## 2012-11-08 ENCOUNTER — Telehealth: Payer: Self-pay | Admitting: Internal Medicine

## 2012-11-08 NOTE — Telephone Encounter (Signed)
Pt's husband would like to know if its ok restart the donepezil (ARICEPT) 5 MG tablet?

## 2012-11-08 NOTE — Telephone Encounter (Signed)
ok 

## 2012-11-09 MED ORDER — HYDROCHLOROTHIAZIDE 25 MG PO TABS: 25.0000 mg | ORAL_TABLET | Freq: Every day | ORAL | Status: DC

## 2012-11-09 MED ORDER — TEMAZEPAM 15 MG PO CAPS: 15.0000 mg | ORAL_CAPSULE | Freq: Every evening | ORAL | Status: DC | PRN

## 2012-11-09 MED ORDER — TRAMADOL HCL 50 MG PO TABS: 50.0000 mg | ORAL_TABLET | Freq: Four times a day (QID) | ORAL | Status: DC | PRN

## 2012-11-09 NOTE — Telephone Encounter (Signed)
Pt returning your call

## 2012-11-09 NOTE — Telephone Encounter (Signed)
Spoke to pt's husband Jomarie Longs told him pt can start back on Donepezil. Jomarie Longs verbalized understanding. Pt and husband also needs refill on medications to go to [facility]. Told him okay will take care of refills for both him and his wife. Jomarie Longs verbalized understanding.

## 2012-11-11 ENCOUNTER — Other Ambulatory Visit: Payer: Self-pay | Admitting: *Deleted

## 2012-11-15 ENCOUNTER — Telehealth: Payer: Self-pay | Admitting: Internal Medicine

## 2012-11-15 NOTE — Telephone Encounter (Signed)
Pt's spouse called about nose drops that was prescribed for patients "salty mouth".  Pt's spouse says that they are not working. Spouse wants to know can pt be scheduled for blood tests to check vitamin B and zinc?  He thinks she may have a deficiency.  Call Mr. Pillard back at [phone]. Please advise.

## 2012-11-15 NOTE — Telephone Encounter (Signed)
Suggest patient take a multivitamin which will treat any possible vitamin deficiency. Routine screening is not warranted

## 2012-11-16 NOTE — Telephone Encounter (Signed)
Please see message and advise 

## 2012-11-16 NOTE — Telephone Encounter (Signed)
Spoke to pt's husband told him pt can take a Multi vitamin, Routine blood tests are not warranted at this time. Hayley Cummings said pt still has a salty mouth and VIt B and zinc deficit have symptoms of salty mouth. Told Hayley Cummings will send message to Dr. Kirtland Bouchard again and get back to him. Hayley Cummings verbalized understanding.

## 2012-11-16 NOTE — Telephone Encounter (Signed)
Spoke to pt's husband Jomarie Longs told him can discontinue nasal spray if not helping and Dr. Kirtland Bouchard recommend vitamin B12 and zinc supplements but no labs needed. Told Jomarie Longs can ask pharmcist what they recommend either at multi vit or separate vitamins. Jomarie Longs verbalized understanding.

## 2012-11-16 NOTE — Telephone Encounter (Signed)
Discontinue nasal spray if this has not been helpful  Recommend vitamin and zinc supplements but no laboratory studies needed

## 2013-01-16 ENCOUNTER — Encounter (HOSPITAL_COMMUNITY): Payer: Self-pay | Admitting: Emergency Medicine

## 2013-01-16 ENCOUNTER — Emergency Department (HOSPITAL_COMMUNITY)
Admission: EM | Admit: 2013-01-16 | Discharge: 2013-01-17 | Disposition: A | Discharge status: Home / Self Care | Payer: Medicare Other | Attending: Emergency Medicine | Admitting: Emergency Medicine

## 2013-01-16 DIAGNOSIS — Z79899 Other long term (current) drug therapy: Secondary | ICD-10-CM | POA: Insufficient documentation

## 2013-01-16 DIAGNOSIS — IMO0002 Reserved for concepts with insufficient information to code with codable children: Secondary | ICD-10-CM | POA: Insufficient documentation

## 2013-01-16 DIAGNOSIS — Z8669 Personal history of other diseases of the nervous system and sense organs: Secondary | ICD-10-CM | POA: Insufficient documentation

## 2013-01-16 DIAGNOSIS — Z860100 Personal history of colon polyps, unspecified: Secondary | ICD-10-CM | POA: Insufficient documentation

## 2013-01-16 DIAGNOSIS — M810 Age-related osteoporosis without current pathological fracture: Secondary | ICD-10-CM | POA: Insufficient documentation

## 2013-01-16 DIAGNOSIS — M1990 Unspecified osteoarthritis, unspecified site: Secondary | ICD-10-CM | POA: Insufficient documentation

## 2013-01-16 DIAGNOSIS — L282 Other prurigo: Secondary | ICD-10-CM

## 2013-01-16 DIAGNOSIS — L299 Pruritus, unspecified: Secondary | ICD-10-CM | POA: Insufficient documentation

## 2013-01-16 NOTE — ED Notes (Signed)
Pt has been having back itching for one month. Pt uses cortisone for treatment. No rash or any indication of reaction to back. Triamcinolone cream used on on back for treatment prior to coming to facility.

## 2013-01-16 NOTE — ED Notes (Signed)
Bed: RU04 Expected date:  Expected time:  Means of arrival:  Comments: EMS back itching from [facility]

## 2013-01-17 MED ORDER — TRIAMCINOLONE ACETONIDE 0.1 % EX CREA: 1.0000 "application " | TOPICAL_CREAM | Freq: Three times a day (TID) | CUTANEOUS | Status: DC

## 2013-01-17 NOTE — ED Provider Notes (Signed)
CSN: [id number]     Arrival date & time 01/16/13  2245 History   First MD Initiated Contact with Patient 01/16/13 2301     Chief Complaint  Patient presents with  . Pruritis   (Consider location/radiation/quality/duration/timing/severity/associated sxs/prior Treatment) HPI Comments: 79 yo female with cognitive impairment, no kidney issues presents with itchy rash to lower back worsening today.  Pt had similar 3 wks ago, resolved with steroid cream however it now returns.  NO new foods/ contact/ meds.  Similar to previous. No rash or vomiting.  Pt well otherwise.  Husband with pt.  Small region.   The history is provided by the patient and the spouse.    Past Medical History  Diagnosis Date  . COLONIC POLYPS, HX OF 11/25/2006  . Memory loss 05/31/2008  . MILD COGNITIVE IMPAIRMENT SO STATED 05/31/2008  . OSTEOARTHRITIS 11/30/2006  . OSTEOPOROSIS 11/25/2006   Past Surgical History  Procedure Laterality Date  . Abdominal hysterectomy    . Total hip revision    . Dilation and curettage of uterus     History reviewed. No pertinent family history. History  Substance Use Topics  . Smoking status: Never Smoker   . Smokeless tobacco: Never Used  . Alcohol Use: No   OB History   Grav Para Term Preterm Abortions TAB SAB Ect Mult Living                 Review of Systems  Constitutional: Negative for fever and chills.  HENT: Negative for congestion.   Respiratory: Negative for shortness of breath.   Cardiovascular: Negative for chest pain.  Gastrointestinal: Negative for vomiting and abdominal pain.  Genitourinary: Negative for dysuria and flank pain.  Musculoskeletal: Negative for back pain, neck pain and neck stiffness.  Skin: Positive for rash.  Neurological: Negative for light-headedness and headaches.    Allergies  Vitamin b-12  Home Medications   Current Outpatient Rx  Name  Route  Sig  Dispense  Refill  . co-enzyme Q-10 30 MG capsule   Oral   Take 30 mg by mouth  daily.         Marland Kitchen donepezil (ARICEPT) 5 MG tablet   Oral   Take 1 tablet (5 mg total) by mouth at bedtime.   90 tablet   6     Please cancel order for name brand Aricept per pt. ...   . fluticasone (FLONASE) 50 MCG/ACT nasal spray   Nasal   Place 2 sprays into the nose daily.   16 g   6   . hydrochlorothiazide (HYDRODIURIL) 25 MG tablet   Oral   Take 1 tablet (25 mg total) by mouth daily.   90 tablet   0   . nabumetone (RELAFEN) 500 MG tablet   Oral   Take 1 tablet (500 mg total) by mouth daily.   90 tablet   1     Short term RF   . temazepam (RESTORIL) 15 MG capsule   Oral   Take 1 capsule (15 mg total) by mouth at bedtime as needed for sleep.   90 capsule   0   . triamcinolone cream (KENALOG) 0.1 %   Topical   Apply 1 application topically 3 (three) times daily. For up to 1 week   15 g   0    BP 122/57  Pulse 86  Temp(Src) 97.7 F (36.5 C) (Oral)  Resp 20  SpO2 97% Physical Exam  Nursing note and vitals reviewed. Constitutional: She  is oriented to person, place, and time. She appears well-developed and well-nourished. No distress.  HENT:  Head: Normocephalic and atraumatic.  Eyes: Conjunctivae are normal. Right eye exhibits no discharge. Left eye exhibits no discharge.  Neck: Normal range of motion. Neck supple. No tracheal deviation present.  Cardiovascular: Normal rate and regular rhythm.   Pulmonary/Chest: Effort normal.  Abdominal: Soft.  Musculoskeletal: She exhibits no edema.  Neurological: She is alert and oriented to person, place, and time.  Skin: Skin is warm. Rash noted.  Mild region of excoriation lower lumbar midline, no warmth, induration or tenderness.  No cellulitis.   Psychiatric: She has a normal mood and affect.    ED Course  Procedures (including critical care time) Labs Review Labs Reviewed - No data to display Imaging Review No results found.  EKG Interpretation   None       MDM   1. Pruritic rash    Concern  for contact dermatitis.  Trial of steroid cream Husband okay on not checking kidney fx and will see pcp after xmas.  Well appearing.  Discussed other causes of pruritis and SE of po benadryl and to try topical.  Results and differential diagnosis were discussed with the patient. Close follow up outpatient was discussed, patient comfortable with the plan.   Diagnosis: Dermatitis, pruritic rash    Enid Skeens, MD 01/17/13 [phone]

## 2013-01-21 ENCOUNTER — Encounter: Payer: Self-pay | Admitting: Family Medicine

## 2013-01-21 ENCOUNTER — Telehealth: Payer: Self-pay | Admitting: Internal Medicine

## 2013-01-21 ENCOUNTER — Ambulatory Visit (INDEPENDENT_AMBULATORY_CARE_PROVIDER_SITE_OTHER): Payer: Medicare Other | Admitting: Family Medicine

## 2013-01-21 ENCOUNTER — Encounter: Payer: Medicare Other | Admitting: Internal Medicine

## 2013-01-21 VITALS — BP 110/68 | HR 95 | Temp 97.4°F | Wt 122.0 lb

## 2013-01-21 DIAGNOSIS — R439 Unspecified disturbances of smell and taste: Secondary | ICD-10-CM

## 2013-01-21 DIAGNOSIS — R432 Parageusia: Secondary | ICD-10-CM

## 2013-01-21 DIAGNOSIS — L299 Pruritus, unspecified: Secondary | ICD-10-CM

## 2013-01-21 LAB — BASIC METABOLIC PANEL
BUN: 44 mg/dL — ABNORMAL HIGH (ref 6–23)
Creatinine, Ser: 1.3 mg/dL — ABNORMAL HIGH (ref 0.4–1.2)
GFR: 40.29 mL/min — ABNORMAL LOW (ref >60.00)
Glucose, Bld: 112 mg/dL — ABNORMAL HIGH (ref 70–99)
Potassium: 3.1 mEq/L — ABNORMAL LOW (ref 3.5–5.1)

## 2013-01-21 LAB — VITAMIN B12: Vitamin B-12: 608 pg/mL (ref 211–911)

## 2013-01-21 MED ORDER — GABAPENTIN 100 MG PO CAPS: ORAL_CAPSULE | ORAL | Status: DC

## 2013-01-21 NOTE — Telephone Encounter (Signed)
[facility] Triage Call Report Triage Record Num: [id number] Operator: Meribeth Mattes Patient Name: Hayley Cummings Call Date & Time: 01/21/2013 6:03:00AM Patient Phone: [phone] PCP: Patient Gender: Female PCP Fax : Patient DOB: 12-09-1922 [facility] Name[facility] Reason for Call: Caller: Joseph/Spouse; PCP: Eleonore Chiquito (Family Practice > 70yrs old); CB#: [phone]; Call regarding Itchy back; onset 12/31/12, only happens at night, slight rash, using cortisone cream, afebrile, was seen at ER 01/16/13, dx pruritus Triage per Rash Protocol. See in 24 hour disposition due to rash and not responding to home care. Home care given. 01/21/13 1000 appt scheduled. Protocol(s) Used: Rash Recommended Outcome per Protocol: See Provider within 24 hours Reason for Outcome: Generalized rash AND not responding to 8 hours of home care Care Advice: ~ Call provider if symptoms worsen or new symptoms develop. Cool/tepid showers or baths may help relieve itching. If cool water alone does not relieve itching, try adding 1/2 to 1 cup baking soda or colloidal oatmeal (Aveeno) to bath water. ~ 12/

## 2013-01-21 NOTE — Progress Notes (Signed)
Pre visit review using our clinic review tool, if applicable. No additional management support is needed unless otherwise documented below in the visit note. 

## 2013-01-21 NOTE — Patient Instructions (Signed)
For recurrent severe itching, try cold compresses/cold pack

## 2013-01-21 NOTE — Telephone Encounter (Signed)
Husband would like you to call on cell phone the results of pt's labs.

## 2013-01-21 NOTE — Progress Notes (Signed)
   Subjective:    Patient ID: Hayley Cummings, female    DOB: 1922/03/19, 79 y.o.   MRN: [id number]  HPI Patient is seen from recent ER visit. She presented there with several week history of intense intermittent episodes of pruritus mostly involving her low back region. No associated rash. She was prescribed steroid cream which has not helped. Her symptoms tend to be worse around 5 AM and definitely worse at night. Episodes usually last 2-3 hours. She describes itching of intensity that she describes almost as a pain. There are no radiculopathy symptoms. They also tried some heat and warm bath without relief.  She is also complaining of" salty mouth". She takes HCTZ for hypertension. She has some history of low GFR by last 2 labs. Family states that she does not tend to drink a lot of fluids. Family are concerned about possibility of B12 or zinc deficiency. She does not have any specific risk factors for B12 deficiency other than her age. No history of anemia.  Past Medical History  Diagnosis Date  . COLONIC POLYPS, HX OF 11/25/2006  . Memory loss 05/31/2008  . MILD COGNITIVE IMPAIRMENT SO STATED 05/31/2008  . OSTEOARTHRITIS 11/30/2006  . OSTEOPOROSIS 11/25/2006   Past Surgical History  Procedure Laterality Date  . Abdominal hysterectomy    . Total hip revision    . Dilation and curettage of uterus      reports that she has never smoked. She has never used smokeless tobacco. She reports that she does not drink alcohol or use illicit drugs. family history is not on file. Allergies  Allergen Reactions  . Vitamin B-12 [Cyanocobalamin] Rash      Review of Systems  Constitutional: Negative for fever, chills, appetite change and unexpected weight change.  Respiratory: Negative for cough.   Cardiovascular: Negative for chest pain.  Genitourinary: Negative for dysuria.  Skin: Negative for color change and rash.  Neurological: Negative for dizziness, seizures and syncope.    Psychiatric/Behavioral: Negative for agitation.       Objective:   Physical Exam  Constitutional: She appears well-developed and well-nourished.  HENT:  Mouth/Throat: Oropharynx is clear and moist.  Neck: Neck supple. No thyromegaly present.  Cardiovascular: Normal rate.   Pulmonary/Chest: Effort normal and breath sounds normal. No respiratory distress. She has no wheezes. She has no rales.  Lymphadenopathy:    She has no cervical adenopathy.  Neurological: She is alert.  Skin:  Patient has a few excoriations lumbar area but no visible rash          Assessment & Plan:  #1 intense episodes of pruritus localized to low back region. Question neuropathic mediated. No history of rash. She has not gotten relief with topicals. Recommended trial of very low dose gabapentin 100 mg each bedtime and she has followup with primary in couple of weeks #2 dysgeusia. Check B12 level. Check zinc level. Increase hydration. Check basic metabolic panel

## 2013-01-23 LAB — ZINC: Zinc: 65 ug/dL (ref 60–130)

## 2013-01-24 NOTE — Telephone Encounter (Signed)
Pt informed

## 2013-01-25 ENCOUNTER — Other Ambulatory Visit: Payer: Self-pay | Admitting: Internal Medicine

## 2013-01-28 ENCOUNTER — Telehealth: Payer: Self-pay | Admitting: Internal Medicine

## 2013-01-28 NOTE — Telephone Encounter (Signed)
Pt states she does not need a refill of the following meds and if you get a request from the pharmacy please disregard:  temazepam (RESTORIL) 15 MG capsule  hydrochlorothiazide (HYDRODIURIL) 25 MG tablet

## 2013-01-29 NOTE — Telephone Encounter (Signed)
Noted  

## 2013-02-14 ENCOUNTER — Ambulatory Visit (INDEPENDENT_AMBULATORY_CARE_PROVIDER_SITE_OTHER): Payer: Medicare Other | Admitting: Internal Medicine

## 2013-02-14 ENCOUNTER — Encounter: Payer: Self-pay | Admitting: Internal Medicine

## 2013-02-14 VITALS — BP 102/60 | HR 71 | Temp 97.6°F | Resp 18 | Ht 58.25 in | Wt 120.0 lb

## 2013-02-14 DIAGNOSIS — M810 Age-related osteoporosis without current pathological fracture: Secondary | ICD-10-CM

## 2013-02-14 DIAGNOSIS — E785 Hyperlipidemia, unspecified: Secondary | ICD-10-CM

## 2013-02-14 DIAGNOSIS — R413 Other amnesia: Secondary | ICD-10-CM

## 2013-02-14 DIAGNOSIS — I10 Essential (primary) hypertension: Secondary | ICD-10-CM

## 2013-02-14 DIAGNOSIS — Z0000 Encounter for general adult medical examination without abnormal findings: Secondary | ICD-10-CM

## 2013-02-14 DIAGNOSIS — M1990 Unspecified osteoarthritis, unspecified site: Secondary | ICD-10-CM

## 2013-02-14 LAB — COMPREHENSIVE METABOLIC PANEL
ALT: 22 U/L (ref 0–35)
AST: 37 U/L (ref 0–37)
Albumin: 3.5 g/dL (ref 3.5–5.2)
Alkaline Phosphatase: 147 U/L — ABNORMAL HIGH (ref 39–117)
BUN: 47 mg/dL — ABNORMAL HIGH (ref 6–23)
CHLORIDE: 97 meq/L (ref 96–112)
CO2: 33 meq/L — AB (ref 19–32)
Calcium: 9.4 mg/dL (ref 8.4–10.5)
Creatinine, Ser: 2 mg/dL — ABNORMAL HIGH (ref 0.4–1.2)
GFR: 25.23 mL/min — ABNORMAL LOW (ref >60.00)
Glucose, Bld: 88 mg/dL (ref 70–99)
POTASSIUM: 3.4 meq/L — AB (ref 3.5–5.1)
SODIUM: 140 meq/L (ref 135–145)
Total Bilirubin: 0.8 mg/dL (ref 0.3–1.2)
Total Protein: 7.7 g/dL (ref 6.0–8.3)

## 2013-02-14 LAB — CBC WITH DIFFERENTIAL/PLATELET
BASOS PCT: 0.3 % (ref 0.0–3.0)
Basophils Absolute: 0 10*3/uL (ref 0.0–0.1)
EOS ABS: 0.1 10*3/uL (ref 0.0–0.7)
EOS PCT: 0.9 % (ref 0.0–5.0)
HCT: 38.2 % (ref 36.0–46.0)
HEMOGLOBIN: 12.8 g/dL (ref 12.0–15.0)
Lymphocytes Relative: 12.6 % (ref 12.0–46.0)
Lymphs Abs: 1.8 10*3/uL (ref 0.7–4.0)
MCHC: 33.6 g/dL (ref 30.0–36.0)
MCV: 93.4 fl (ref 78.0–100.0)
Monocytes Absolute: 1.2 10*3/uL — ABNORMAL HIGH (ref 0.1–1.0)
Monocytes Relative: 8 % (ref 3.0–12.0)
NEUTROS ABS: 11.4 10*3/uL — AB (ref 1.4–7.7)
NEUTROS PCT: 78.2 % — AB (ref 43.0–77.0)
Platelets: 365 10*3/uL (ref 150.0–400.0)
RBC: 4.09 Mil/uL (ref 3.87–5.11)
RDW: 14 % (ref 11.5–14.6)
WBC: 14.5 10*3/uL — AB (ref 4.5–10.5)

## 2013-02-14 LAB — LDL CHOLESTEROL, DIRECT: Direct LDL: 161.2 mg/dL

## 2013-02-14 LAB — LIPID PANEL
CHOL/HDL RATIO: 5
Cholesterol: 237 mg/dL — ABNORMAL HIGH (ref 0–200)
HDL: 50.2 mg/dL (ref >39.00)
Triglycerides: 128 mg/dL (ref 0.0–149.0)
VLDL: 25.6 mg/dL (ref 0.0–40.0)

## 2013-02-14 MED ORDER — DONEPEZIL HCL 10 MG PO TABS: 10.0000 mg | ORAL_TABLET | Freq: Every day | ORAL | Status: DC

## 2013-02-14 NOTE — Progress Notes (Signed)
Patient ID: Hayley Cummings, female   DOB: 06-13-1922, 80 y.o.   MRN: [id number]  Subjective:    Patient ID: AAMINAH FORRESTER, female    DOB: 01-11-1923, 80 y.o.   MRN: [id number]  Hypertension Pertinent negatives include no chest pain, headaches, palpitations or shortness of breath.   History of Present Illness:   80   year-old patient who is seen today for a wellness exam.   She has a history of osteoarthritis as well as osteoporosis. She has been treated with Fosamax in the past;  she has had a remote splenectomy and also has a history of mild cognitive impairment.  More recently the patient has complained of a salty taste in her mouth this symptom has resolved. Aricept has been on hold. The patient was seen last month complaining of some pruritus involving the low back area.  The patient was placed on Neurontin.  Laboratory studies revealed hypokalemia and slightly elevated creatinine at 1.3. The patient's daughter is concerned about chronic kidney disease and apparently patient was placed on Neurontin due to neuropathy. The patient also complains of some mild urinary urgency but denies any incontinence  Here for Medicare AWV:    1. Risk factors based on Past M, S, F history: cardiovascular risk factors include age  41. Physical Activities: fairly active. Mild exercise limitations due to arthritis  3. Depression/mood: no  history depression, or mood disorder  4. Hearing: no major deficits  5. ADL's: independent in all aspects of daily living  6. Fall Risk: moderate due to age  50. Home Safety: no problems identified  8. Height, weight, &visual acuity:height and weight stable. No difficulty with visual acuity appeared  9. Counseling: calcium and vitamin D supplementation, and encouraged  10. Labs ordered based on risk factors: laboratory profile will be reviewed  11. Referral Coordination- none appropriate at this time  12. Care Plan- heart healthy diet more regular exercise. Encouraged   13. Cognitive Assessment- alert and oriented, with normal affect. History mild cognitive dysfunction   Allergies:    Past History:  Past Medical History:   Colonic polyps, hx of  Osteoporosis  Osteoarthritis  insomnia  cognitive impairment   Past Surgical History:   Colonoscopy 2007  D/C  Splenectomy 66  Hysterectomy September 2008  status post right hip, revision 2002  gravida 5, para 4, abortus one   Family History:   Family History Seizures  father died age 31, history of seizure disorder  mother accidental death at age 21  one brother one sister in good health other than osteoarthritis   Social History:   Retired  Married  Never Smoked      Review of Systems  Constitutional: Negative for fever, appetite change, fatigue and unexpected weight change.  HENT: Negative for congestion, dental problem, ear pain, hearing loss, mouth sores, nosebleeds, sinus pressure, sore throat, tinnitus, trouble swallowing and voice change.   Eyes: Negative for photophobia, pain, redness and visual disturbance.  Respiratory: Negative for cough, chest tightness and shortness of breath.   Cardiovascular: Negative for chest pain, palpitations and leg swelling.  Gastrointestinal: Negative for nausea, vomiting, abdominal pain, diarrhea, constipation, blood in stool, abdominal distention and rectal pain.  Genitourinary: Negative for dysuria, urgency, frequency, hematuria, flank pain, vaginal bleeding, vaginal discharge, difficulty urinating, genital sores, vaginal pain, menstrual problem and pelvic pain.  Musculoskeletal: Negative for arthralgias, back pain and neck stiffness.  Skin: Negative for rash.  Neurological: Negative for dizziness, syncope, speech difficulty, weakness, light-headedness, numbness  and headaches.  Hematological: Negative for adenopathy. Does not bruise/bleed easily.  Psychiatric/Behavioral: Positive for confusion. Negative for suicidal ideas, behavioral problems,  self-injury, dysphoric mood and agitation. The patient is not nervous/anxious.        Objective:   Physical Exam  Constitutional: She is oriented to person, place, and time. She appears well-developed and well-nourished.  Blood pressure 170/80  HENT:  Head: Normocephalic and atraumatic.  Right Ear: External ear normal.  Left Ear: External ear normal.  Mouth/Throat: Oropharynx is clear and moist.  Eyes: Conjunctivae and EOM are normal.  Neck: Normal range of motion. Neck supple. No JVD present. No thyromegaly present.  Cardiovascular: Normal rate, regular rhythm and intact distal pulses.   Murmur heard. Grade 2/6 systolic murmur loudest at the base  Pulmonary/Chest: Effort normal and breath sounds normal. She has no wheezes. She has no rales.  Abdominal: Soft. Bowel sounds are normal. She exhibits no distension and no mass. There is no tenderness. There is no rebound and no guarding.  Scar left flank area  Genitourinary: Guaiac negative stool.  Musculoskeletal: Normal range of motion. She exhibits no edema and no tenderness.  Neurological: She is alert and oriented to person, place, and time. She has normal reflexes. No cranial nerve deficit. She exhibits normal muscle tone. Coordination normal.  Skin: Skin is warm and dry. No rash noted.  Psychiatric: She has a normal mood and affect. Her behavior is normal.          Assessment & Plan:   Preventive health examination Osteoporosis osteoarthritis Cognitive impairment . We'll resume Aricept Status post splenectomy History of Systolic hypertension.  We'll discontinue hydrochlorothiazide in view of azotemia and hyperkalemia. We'll monitor blood pressures off medication Mild chronic kidney disease. Anti-inflammatory medications will be discontinued  The family has requested referral to a gerontologist  History of insomnia. Patient feels that she can discontinue Restoril. This was encouraged

## 2013-02-14 NOTE — Progress Notes (Signed)
Pre-visit discussion using our clinic review tool. No additional management support is needed unless otherwise documented below in the visit note.  

## 2013-02-14 NOTE — Patient Instructions (Signed)
Limit your sodium (Salt) intake  Please check your blood pressure on a regular basis.  If it is consistently greater than 150/90, please make an office appointment.  Discontinue hydrochlorothiazide Neurontin and Relafen  Use Restoril only when absolutely necessary  Empty your bladder frequently  throughout the day and before bedtime as discussed  Take [phone] mg of Tylenol every 6 hours as needed for pain relief or fever.  Avoid taking more than 3000 mg in a 24-hour period (  This may cause liver damage).  Take a calcium supplement, plus [phone] units of vitamin D

## 2013-02-15 ENCOUNTER — Other Ambulatory Visit: Payer: Self-pay | Admitting: *Deleted

## 2013-02-15 NOTE — Telephone Encounter (Signed)
Rx for Aricept was sent to [facility].

## 2013-02-18 ENCOUNTER — Telehealth: Payer: Self-pay | Admitting: Internal Medicine

## 2013-02-18 NOTE — Telephone Encounter (Signed)
[facility];  Returning patient call;  Not able to reach caller, message left.

## 2013-02-18 NOTE — Telephone Encounter (Signed)
Received message from daughter Mechele Claude regarding pt and Gabapentin and itching, was told to stop medication at physical but has not stopped. Discussed with Dr. Raliegh Ip and he said pt to stop Gabapentin and try Benadryl OTC

## 2013-02-18 NOTE — Telephone Encounter (Signed)
Spoke to pt's husband told him to stop Gabapentin and try Benadryl for itching per Dr. Raliegh Ip and call on Monday if not better. Mr Rivenbark said Gabapentin is helping itching and that he gave it to her late last night and that is why she was itching. Told him to try what Dr. Raliegh Ip said and get back to me on Monday. Mr Riling verbalized understanding.

## 2013-02-21 ENCOUNTER — Ambulatory Visit: Payer: Medicare Other | Admitting: *Deleted

## 2013-02-21 NOTE — Telephone Encounter (Signed)
Mechele Claude pt's daughter here today for an appt and she said pt is taking Benadryl as directed and has stopped the Gabapentin. Wants to know how long should take. Told her to discuss with Dr.K at visit. Mechele Claude verbalized understanding.

## 2013-03-02 ENCOUNTER — Telehealth: Payer: Self-pay | Admitting: Internal Medicine

## 2013-03-02 NOTE — Telephone Encounter (Signed)
Relevant patient education assigned to patient using Emmi. ° °

## 2013-03-21 NOTE — Progress Notes (Signed)
   Subjective:    Patient ID: Hayley Cummings, female    DOB: [birth year], 80 y.o.   MRN: [id number]  HPI    Review of Systems     Objective:   Physical Exam        Assessment & Plan:  Pt not seen

## 2013-04-12 ENCOUNTER — Ambulatory Visit (INDEPENDENT_AMBULATORY_CARE_PROVIDER_SITE_OTHER): Payer: Medicare Other | Admitting: Internal Medicine

## 2013-04-12 ENCOUNTER — Encounter: Payer: Self-pay | Admitting: Internal Medicine

## 2013-04-12 VITALS — BP 130/72 | HR 63 | Temp 97.9°F | Resp 18 | Ht 58.5 in | Wt 121.0 lb

## 2013-04-12 DIAGNOSIS — G3184 Mild cognitive impairment, so stated: Secondary | ICD-10-CM

## 2013-04-12 DIAGNOSIS — I10 Essential (primary) hypertension: Secondary | ICD-10-CM

## 2013-04-12 DIAGNOSIS — N189 Chronic kidney disease, unspecified: Secondary | ICD-10-CM

## 2013-04-12 NOTE — Patient Instructions (Signed)
Limit your sodium (Salt) intake  Please check your blood pressure on a regular basis.  If it is consistently greater than 150/90, please make an office appointment.  Return in 3 months for follow-up  

## 2013-04-12 NOTE — Progress Notes (Signed)
Pre-visit discussion using our clinic review tool. No additional management support is needed unless otherwise documented below in the visit note.  

## 2013-04-12 NOTE — Progress Notes (Signed)
Subjective:    Patient ID: Hayley Cummings, female    DOB: 09/09/1922, 80 y.o.   MRN: [id number]  HPI   80 year old patient who has a history of systolic hypertension.  Diuretic therapy was discontinued about 6 weeks ago due to azotemia.  She presently has been off all blood pressure medication.  She has done fairly well.  Blood pressure today is well controlled off therapy. She has a history of cognitive impairment and has been on Aricept.  Past Medical History  Diagnosis Date  . COLONIC POLYPS, HX OF 11/25/2006  . Memory loss 05/31/2008  . MILD COGNITIVE IMPAIRMENT SO STATED 05/31/2008  . OSTEOARTHRITIS 11/30/2006  . OSTEOPOROSIS 11/25/2006    History   Social History  . Marital Status: Married    Spouse Name: N/A    Number of Children: N/A  . Years of Education: N/A   Occupational History  . Not on file.   Social History Main Topics  . Smoking status: Never Smoker   . Smokeless tobacco: Never Used  . Alcohol Use: No  . Drug Use: No  . Sexual Activity: Not on file   Other Topics Concern  . Not on file   Social History Narrative  . No narrative on file    Past Surgical History  Procedure Laterality Date  . Abdominal hysterectomy    . Total hip revision    . Dilation and curettage of uterus      No family history on file.  Allergies  Allergen Reactions  . Vitamin B-12 [Cyanocobalamin] Rash    Current Outpatient Prescriptions on File Prior to Visit  Medication Sig Dispense Refill  . donepezil (ARICEPT) 10 MG tablet Take 1 tablet (10 mg total) by mouth at bedtime.  90 tablet  6  . fluticasone (FLONASE) 50 MCG/ACT nasal spray Place 2 sprays into the nose daily.  16 g  6  . triamcinolone cream (KENALOG) 0.1 % Apply 1 application topically 3 (three) times daily. For up to 1 week  15 g  0   No current facility-administered medications on file prior to visit.    BP 130/72  Pulse 63  Temp(Src) 97.9 F (36.6 C) (Oral)  Resp 18  Ht 4' 10.5" (1.486 m)  Wt 121  lb (54.885 kg)  BMI 24.86 kg/m2  SpO2 98%       Review of Systems  Constitutional: Negative.   HENT: Negative for congestion, dental problem, hearing loss, rhinorrhea, sinus pressure, sore throat and tinnitus.   Eyes: Negative for pain, discharge and visual disturbance.  Respiratory: Negative for cough and shortness of breath.   Cardiovascular: Negative for chest pain, palpitations and leg swelling.  Gastrointestinal: Negative for nausea, vomiting, abdominal pain, diarrhea, constipation, blood in stool and abdominal distention.  Genitourinary: Negative for dysuria, urgency, frequency, hematuria, flank pain, vaginal bleeding, vaginal discharge, difficulty urinating, vaginal pain and pelvic pain.  Musculoskeletal: Negative for arthralgias, gait problem and joint swelling.  Skin: Negative for rash.  Neurological: Negative for dizziness, syncope, speech difficulty, weakness, numbness and headaches.  Hematological: Negative for adenopathy.  Psychiatric/Behavioral: Positive for confusion. Negative for behavioral problems, dysphoric mood and agitation. The patient is not nervous/anxious.        Objective:   Physical Exam  Constitutional: She appears well-developed and well-nourished. No distress.  Blood pressure 130/68  Neck: Normal range of motion.  Cardiovascular: Normal rate and regular rhythm.   Pulmonary/Chest: Effort normal and breath sounds normal. No respiratory distress.  Assessment & Plan:   Normotensive off medication History of renal insufficiency.  We'll check followup lab Cognitive impairment.  We'll continue Aricept

## 2013-04-13 LAB — BASIC METABOLIC PANEL
BUN: 28 mg/dL — AB (ref 6–23)
CALCIUM: 9.4 mg/dL (ref 8.4–10.5)
CO2: 31 mEq/L (ref 19–32)
Chloride: 101 mEq/L (ref 96–112)
Creatinine, Ser: 1.2 mg/dL (ref 0.4–1.2)
GFR: 45.39 mL/min — ABNORMAL LOW (ref >60.00)
Glucose, Bld: 77 mg/dL (ref 70–99)
POTASSIUM: 4.2 meq/L (ref 3.5–5.1)
Sodium: 138 mEq/L (ref 135–145)

## 2013-05-02 ENCOUNTER — Telehealth: Payer: Self-pay | Admitting: Internal Medicine

## 2013-05-02 DIAGNOSIS — M1990 Unspecified osteoarthritis, unspecified site: Secondary | ICD-10-CM

## 2013-05-02 DIAGNOSIS — R413 Other amnesia: Secondary | ICD-10-CM

## 2013-05-02 DIAGNOSIS — M810 Age-related osteoporosis without current pathological fracture: Secondary | ICD-10-CM

## 2013-05-02 DIAGNOSIS — I10 Essential (primary) hypertension: Secondary | ICD-10-CM

## 2013-05-02 DIAGNOSIS — G3184 Mild cognitive impairment, so stated: Secondary | ICD-10-CM

## 2013-05-02 NOTE — Telephone Encounter (Signed)
Left message on voicemail to call office.  

## 2013-05-02 NOTE — Telephone Encounter (Signed)
ok 

## 2013-05-02 NOTE — Telephone Encounter (Signed)
Okay for referral?

## 2013-05-02 NOTE — Telephone Encounter (Signed)
Pt daughter is requesting home health assistant due to mother having dementia

## 2013-05-03 NOTE — Telephone Encounter (Signed)
Spoke to [place] she said cancel order for Home Health for now are going to focus on father for now. Told her okay, will cancel order.

## 2013-05-23 ENCOUNTER — Telehealth: Payer: Self-pay | Admitting: Internal Medicine

## 2013-05-23 MED ORDER — ALPRAZOLAM 0.25 MG PO TABS: 0.2500 mg | ORAL_TABLET | Freq: Two times a day (BID) | ORAL | Status: AC | PRN

## 2013-05-23 NOTE — Telephone Encounter (Signed)
Spoke to pt told her calling Rx into pharmacy for her to help with anxiety, Xanax can take one tablet twice a day as needed. Pt asked what pharmacy? I asked pt which she would like, pt passed the phone to her daughter Mechele Claude. Told Mechele Claude need to know what pharmacy to call Rx into. Mechele Claude said [facility]. Told her okay and also discussed with her about pt and husband filling out DPR so we can speak to family. Mechele Claude said yes, do they need to be in the office? Told her no can come to the office and pickup forms and bring back. Mechele Claude verbalized understanding. Rx called into pharmacy,

## 2013-05-23 NOTE — Telephone Encounter (Signed)
Please advise 

## 2013-05-23 NOTE — Telephone Encounter (Addendum)
Pt's daughter, Hayley Cummings called . Pt's husband DX w/ cancer. Pt has been having episodes of crying (sometimes all day long) and distress.  A question will set her off, just the smallest thing will ditress her all day. No dpr on file. Going to send Daughter dpr asap. But for the time being, she feels like pt may need a med to help w/ this. Daughter refused to make appt stating pt was in for appt in March.  Mrs Cummings realizes she is not on dpr at the present, but would like a cb just to let her know if we can call anything in or does pt need appt. pls advise [facility]

## 2013-05-23 NOTE — Telephone Encounter (Signed)
Alprazolam 0.25  Twice daily prn anxiety #60

## 2013-06-14 ENCOUNTER — Telehealth: Payer: Self-pay | Admitting: Internal Medicine

## 2013-06-14 NOTE — Telephone Encounter (Signed)
Pt daughter is requesting a letter from dr Burnice Logan stating her mother has dementia fax to attn [place] [employer] [phone] ph # [phone]. Pt daughter will fax POA today. Pt daughter is at attorney office now. I told daughter may take up to 3 business days

## 2013-06-16 ENCOUNTER — Encounter: Payer: Self-pay | Admitting: Internal Medicine

## 2013-06-16 NOTE — Telephone Encounter (Signed)
Pt's daughter Mechele Claude called again about the status of the POA letter.  I advised her it is not ready and she is upset about the delay in response to the letter.  She asked if Dr. Raliegh Ip was in and I told her he was in but seeing patients at this time.  She would like a call letting her know when it is ready.

## 2013-06-16 NOTE — Telephone Encounter (Signed)
Pt's daughter states she needs to know if Dr.K is going to sign the form by 10:00am this morning. The lawyer is waiting on the document to be faxed to the lawyer.

## 2013-06-16 NOTE — Telephone Encounter (Signed)
Letter faxed and daughter is aware.

## 2013-06-16 NOTE — Telephone Encounter (Signed)
Letter dictated; please call for p/u

## 2013-06-27 DIAGNOSIS — Z0279 Encounter for issue of other medical certificate: Secondary | ICD-10-CM

## 2013-08-17 DIAGNOSIS — Z0279 Encounter for issue of other medical certificate: Secondary | ICD-10-CM

## 2013-08-24 ENCOUNTER — Telehealth: Payer: Self-pay | Admitting: Internal Medicine

## 2013-09-14 ENCOUNTER — Telehealth: Payer: Self-pay | Admitting: Internal Medicine

## 2013-09-14 NOTE — Telephone Encounter (Signed)
Hayley Cummings is calling in regards to pt FL2 form, states dr. Raliegh Ip had provided one, and in transition it has been misplaced by the family, and they never received it, want to know if a copy can be faxed. Fax # [phone].

## 2013-09-15 NOTE — Telephone Encounter (Signed)
Received FL2 form, filled out by Dr. Raliegh Ip and faxed back to [facility] at [phone].

## 2013-09-23 NOTE — Telephone Encounter (Signed)
error 

## 2014-01-05 ENCOUNTER — Ambulatory Visit: Payer: Medicare Other | Attending: Orthopedic Surgery

## 2014-01-05 DIAGNOSIS — R262 Difficulty in walking, not elsewhere classified: Secondary | ICD-10-CM | POA: Insufficient documentation

## 2014-01-05 DIAGNOSIS — R5381 Other malaise: Secondary | ICD-10-CM | POA: Insufficient documentation

## 2014-01-05 DIAGNOSIS — R269 Unspecified abnormalities of gait and mobility: Secondary | ICD-10-CM | POA: Insufficient documentation

## 2014-01-05 DIAGNOSIS — M6281 Muscle weakness (generalized): Secondary | ICD-10-CM | POA: Diagnosis present

## 2014-01-10 ENCOUNTER — Ambulatory Visit: Payer: Medicare Other

## 2014-01-10 DIAGNOSIS — M6281 Muscle weakness (generalized): Secondary | ICD-10-CM | POA: Diagnosis not present

## 2014-01-17 ENCOUNTER — Ambulatory Visit: Payer: Medicare Other | Admitting: Physical Therapy

## 2014-01-17 DIAGNOSIS — M6281 Muscle weakness (generalized): Secondary | ICD-10-CM | POA: Diagnosis not present

## 2014-01-19 ENCOUNTER — Ambulatory Visit: Payer: Medicare Other | Admitting: Physical Therapy

## 2014-01-24 ENCOUNTER — Ambulatory Visit: Payer: Medicare Other | Admitting: Physical Therapy

## 2014-01-24 DIAGNOSIS — M6281 Muscle weakness (generalized): Secondary | ICD-10-CM | POA: Diagnosis not present

## 2014-01-26 ENCOUNTER — Ambulatory Visit: Payer: Medicare Other | Admitting: Physical Therapy

## 2014-01-26 DIAGNOSIS — M6281 Muscle weakness (generalized): Secondary | ICD-10-CM | POA: Diagnosis not present

## 2014-01-31 ENCOUNTER — Ambulatory Visit: Payer: Medicare Other | Attending: Orthopedic Surgery | Admitting: Physical Therapy

## 2014-01-31 DIAGNOSIS — R5381 Other malaise: Secondary | ICD-10-CM | POA: Diagnosis not present

## 2014-01-31 DIAGNOSIS — M6281 Muscle weakness (generalized): Secondary | ICD-10-CM | POA: Insufficient documentation

## 2014-01-31 DIAGNOSIS — R262 Difficulty in walking, not elsewhere classified: Secondary | ICD-10-CM | POA: Insufficient documentation

## 2014-01-31 DIAGNOSIS — R269 Unspecified abnormalities of gait and mobility: Secondary | ICD-10-CM | POA: Insufficient documentation

## 2014-02-02 ENCOUNTER — Other Ambulatory Visit: Payer: Self-pay | Admitting: Geriatric Medicine

## 2014-02-02 DIAGNOSIS — R109 Unspecified abdominal pain: Secondary | ICD-10-CM

## 2014-02-02 DIAGNOSIS — R1900 Intra-abdominal and pelvic swelling, mass and lump, unspecified site: Secondary | ICD-10-CM

## 2014-02-07 ENCOUNTER — Ambulatory Visit: Payer: Medicare Other

## 2014-02-07 DIAGNOSIS — M6281 Muscle weakness (generalized): Secondary | ICD-10-CM | POA: Diagnosis not present

## 2014-02-09 ENCOUNTER — Ambulatory Visit
Admission: RE | Admit: 2014-02-09 | Discharge: 2014-02-09 | Disposition: A | Discharge status: Home / Self Care | Payer: Medicare Other | Source: Ambulatory Visit | Attending: Geriatric Medicine | Admitting: Geriatric Medicine

## 2014-02-09 DIAGNOSIS — R1900 Intra-abdominal and pelvic swelling, mass and lump, unspecified site: Secondary | ICD-10-CM

## 2014-02-09 DIAGNOSIS — R109 Unspecified abdominal pain: Secondary | ICD-10-CM

## 2014-02-09 MED ORDER — IOHEXOL 300 MG/ML  SOLN
80.0000 mL | Freq: Once | INTRAMUSCULAR | Status: AC | PRN
  Administered 2014-02-09: 80 mL via INTRAVENOUS

## 2014-02-14 ENCOUNTER — Encounter: Payer: Self-pay | Admitting: *Deleted

## 2014-02-14 ENCOUNTER — Ambulatory Visit: Payer: Medicare Other

## 2014-02-14 NOTE — Progress Notes (Signed)
Dr. Benay Spice received referral by Dr. Reymundo Poll for new patient appointment. Dx: Metastatic colon cancer. He will have her seen by Ned Card, NP on 02/20/14 at 11:15. Message forwarded to [place] in HIM with note to call daughter, Thressa Sheller [phone]

## 2014-02-15 ENCOUNTER — Telehealth: Payer: Self-pay | Admitting: *Deleted

## 2014-02-15 ENCOUNTER — Telehealth: Payer: Self-pay | Admitting: Oncology

## 2014-02-15 NOTE — Telephone Encounter (Signed)
Per Dr. Benay Spice; spoke with Hayley Cummings explaining MD will re-schedule appt d/t pt [facility] can only transport pt on [place].  Ms. Hayley Cummings request that MD call her re: pt and "maybe not telling her this dx due to her dementia; recent death of husband"  Informed pt that MD will be in contact in the next few days.   Hayley Cummings contact # [phone].  Note to Dr. Benay Spice.

## 2014-02-15 NOTE — Telephone Encounter (Signed)
Received message from pt's daughter Thressa Sheller "we need to have a conference without mother there before she hears about this cancer diagnosis, can we do that?"  States "mother has dementia and is almost 79 years old and this will throw her for a loop"  Note to Dr. Benay Spice.  New Patient appt is schedule with Ned Card, NP 02/20/13.

## 2014-02-15 NOTE — Telephone Encounter (Signed)
LEFT MESSAGE FOR PATIENT DTR TERESA TO RETURN CALL TO SCHEDULE NP APPT

## 2014-02-16 ENCOUNTER — Ambulatory Visit: Payer: Medicare Other

## 2014-02-17 ENCOUNTER — Telehealth: Payer: Self-pay | Admitting: Oncology

## 2014-02-17 NOTE — Telephone Encounter (Signed)
PATIENT R/S TO SEE DR. [facility] 01/28 @ 1:30/

## 2014-02-20 ENCOUNTER — Ambulatory Visit: Payer: Medicare Other

## 2014-02-20 ENCOUNTER — Ambulatory Visit: Payer: Medicare Other | Admitting: Nurse Practitioner

## 2014-02-21 ENCOUNTER — Inpatient Hospital Stay (HOSPITAL_COMMUNITY)
Admission: EM | Admit: 2014-02-21 | Discharge: 2014-03-28 | DRG: 871 | Disposition: E | Payer: Medicare Other | Attending: Internal Medicine | Admitting: Internal Medicine

## 2014-02-21 ENCOUNTER — Encounter (HOSPITAL_COMMUNITY): Payer: Self-pay | Admitting: Emergency Medicine

## 2014-02-21 ENCOUNTER — Ambulatory Visit: Payer: Medicare Other

## 2014-02-21 DIAGNOSIS — F0390 Unspecified dementia without behavioral disturbance: Secondary | ICD-10-CM | POA: Diagnosis present

## 2014-02-21 DIAGNOSIS — R0902 Hypoxemia: Secondary | ICD-10-CM | POA: Diagnosis not present

## 2014-02-21 DIAGNOSIS — R6520 Severe sepsis without septic shock: Secondary | ICD-10-CM | POA: Diagnosis present

## 2014-02-21 DIAGNOSIS — G3184 Mild cognitive impairment, so stated: Secondary | ICD-10-CM | POA: Diagnosis present

## 2014-02-21 DIAGNOSIS — N179 Acute kidney failure, unspecified: Secondary | ICD-10-CM | POA: Diagnosis present

## 2014-02-21 DIAGNOSIS — R601 Generalized edema: Secondary | ICD-10-CM | POA: Diagnosis not present

## 2014-02-21 DIAGNOSIS — Z66 Do not resuscitate: Secondary | ICD-10-CM | POA: Diagnosis present

## 2014-02-21 DIAGNOSIS — Y95 Nosocomial condition: Secondary | ICD-10-CM | POA: Diagnosis present

## 2014-02-21 DIAGNOSIS — C787 Secondary malignant neoplasm of liver and intrahepatic bile duct: Secondary | ICD-10-CM | POA: Diagnosis present

## 2014-02-21 DIAGNOSIS — F419 Anxiety disorder, unspecified: Secondary | ICD-10-CM | POA: Diagnosis present

## 2014-02-21 DIAGNOSIS — N390 Urinary tract infection, site not specified: Secondary | ICD-10-CM | POA: Diagnosis present

## 2014-02-21 DIAGNOSIS — R531 Weakness: Secondary | ICD-10-CM

## 2014-02-21 DIAGNOSIS — J189 Pneumonia, unspecified organism: Secondary | ICD-10-CM

## 2014-02-21 DIAGNOSIS — J90 Pleural effusion, not elsewhere classified: Secondary | ICD-10-CM | POA: Diagnosis present

## 2014-02-21 DIAGNOSIS — C801 Malignant (primary) neoplasm, unspecified: Secondary | ICD-10-CM | POA: Diagnosis present

## 2014-02-21 DIAGNOSIS — A419 Sepsis, unspecified organism: Secondary | ICD-10-CM | POA: Diagnosis not present

## 2014-02-21 DIAGNOSIS — M1990 Unspecified osteoarthritis, unspecified site: Secondary | ICD-10-CM | POA: Diagnosis present

## 2014-02-21 DIAGNOSIS — R109 Unspecified abdominal pain: Secondary | ICD-10-CM | POA: Diagnosis present

## 2014-02-21 DIAGNOSIS — J690 Pneumonitis due to inhalation of food and vomit: Secondary | ICD-10-CM | POA: Diagnosis present

## 2014-02-21 DIAGNOSIS — E860 Dehydration: Secondary | ICD-10-CM | POA: Diagnosis present

## 2014-02-21 DIAGNOSIS — R197 Diarrhea, unspecified: Secondary | ICD-10-CM

## 2014-02-21 DIAGNOSIS — G893 Neoplasm related pain (acute) (chronic): Secondary | ICD-10-CM | POA: Diagnosis present

## 2014-02-21 DIAGNOSIS — Z515 Encounter for palliative care: Secondary | ICD-10-CM

## 2014-02-21 DIAGNOSIS — M810 Age-related osteoporosis without current pathological fracture: Secondary | ICD-10-CM | POA: Diagnosis present

## 2014-02-21 DIAGNOSIS — I10 Essential (primary) hypertension: Secondary | ICD-10-CM | POA: Diagnosis present

## 2014-02-21 DIAGNOSIS — E162 Hypoglycemia, unspecified: Secondary | ICD-10-CM | POA: Diagnosis present

## 2014-02-21 DIAGNOSIS — R112 Nausea with vomiting, unspecified: Secondary | ICD-10-CM | POA: Diagnosis present

## 2014-02-21 DIAGNOSIS — N1330 Unspecified hydronephrosis: Secondary | ICD-10-CM | POA: Diagnosis present

## 2014-02-21 LAB — CBC WITH DIFFERENTIAL/PLATELET
Basophils Absolute: 0 10*3/uL (ref 0.0–0.1)
Basophils Relative: 0 % (ref 0–1)
Eosinophils Absolute: 0 10*3/uL (ref 0.0–0.7)
Eosinophils Relative: 0 % (ref 0–5)
HCT: 36.9 % (ref 36.0–46.0)
Hemoglobin: 12.1 g/dL (ref 12.0–15.0)
Lymphocytes Relative: 2 % — ABNORMAL LOW (ref 12–46)
Lymphs Abs: 0.3 10*3/uL — ABNORMAL LOW (ref 0.7–4.0)
MCH: 33.7 pg (ref 26.0–34.0)
MCHC: 32.8 g/dL (ref 30.0–36.0)
MCV: 102.8 fL — ABNORMAL HIGH (ref 78.0–100.0)
Monocytes Absolute: 0.3 10*3/uL (ref 0.1–1.0)
Monocytes Relative: 2 % — ABNORMAL LOW (ref 3–12)
Neutro Abs: 17.9 10*3/uL — ABNORMAL HIGH (ref 1.7–7.7)
Neutrophils Relative %: 96 % — ABNORMAL HIGH (ref 43–77)
Platelets: 311 10*3/uL (ref 150–400)
RBC: 3.59 MIL/uL — ABNORMAL LOW (ref 3.87–5.11)
RDW: 16.7 % — ABNORMAL HIGH (ref 11.5–15.5)
WBC: 18.5 10*3/uL — ABNORMAL HIGH (ref 4.0–10.5)

## 2014-02-21 MED ORDER — SODIUM CHLORIDE 0.9 % IV BOLUS (SEPSIS)
500.0000 mL | Freq: Once | INTRAVENOUS | Status: AC
  Administered 2014-02-22: 500 mL via INTRAVENOUS

## 2014-02-21 MED ORDER — FENTANYL CITRATE 0.05 MG/ML IJ SOLN
100.0000 ug | Freq: Once | INTRAMUSCULAR | Status: AC
  Administered 2014-02-22: 100 ug via INTRAVENOUS
  Filled 2014-02-21: qty 2

## 2014-02-21 MED ORDER — ONDANSETRON HCL 4 MG/2ML IJ SOLN
4.0000 mg | Freq: Once | INTRAMUSCULAR | Status: AC
  Administered 2014-02-22: 4 mg via INTRAVENOUS
  Filled 2014-02-21: qty 2

## 2014-02-21 NOTE — ED Provider Notes (Signed)
CSN: [id number]     Arrival date & time 02/14/2014  2322 History   First MD Initiated Contact with Patient 02/24/2014 2337     Chief Complaint  Patient presents with  . abdominal distention   . Emesis  . Diarrhea     (Consider location/radiation/quality/duration/timing/severity/associated sxs/prior Treatment) HPI Patient with recent diagnosis of metastatic carcinoma with large masses in liver bilateral lungs and left pelvis causing left-sided hydronephrosis presents from nursing home with worsening abdominal distention, nausea, vomiting and episodes of diarrhea. Patient is unable to contribute to history due to baseline dementia. Per daughters in the room patient has yet to speak with oncologist but no treatment is being pursued this point. Per EMS patient was hypotensive en route. Past Medical History  Diagnosis Date  . COLONIC POLYPS, HX OF 11/25/2006  . Memory loss 05/31/2008  . MILD COGNITIVE IMPAIRMENT SO STATED 05/31/2008  . OSTEOARTHRITIS 11/30/2006  . OSTEOPOROSIS 11/25/2006   Past Surgical History  Procedure Laterality Date  . Abdominal hysterectomy    . Total hip revision    . Dilation and curettage of uterus     No family history on file. History  Substance Use Topics  . Smoking status: Never Smoker   . Smokeless tobacco: Never Used  . Alcohol Use: No   OB History    No data available     Review of Systems  Unable to perform ROS: Dementia  Gastrointestinal: Positive for nausea, vomiting, abdominal pain, diarrhea and abdominal distention.      Allergies  Vitamin b-12  Home Medications   Prior to Admission medications   Medication Sig Start Date End Date Taking? Authorizing Provider  acetaminophen (TYLENOL) 500 MG tablet Take 500 mg by mouth 2 (two) times daily.   Yes Historical Provider, MD  ALPRAZolam (XANAX) 0.25 MG tablet Take 1 tablet (0.25 mg total) by mouth 2 (two) times daily as needed for anxiety. 05/23/13  Yes Marletta Lor, MD  lisinopril  (PRINIVIL,ZESTRIL) 2.5 MG tablet Take 2.5 mg by mouth daily.   Yes Historical Provider, MD  sertraline (ZOLOFT) 25 MG tablet Take 12.5 mg by mouth daily.   Yes Historical Provider, MD  donepezil (ARICEPT) 10 MG tablet Take 1 tablet (10 mg total) by mouth at bedtime. 02/14/13   Marletta Lor, MD  fluticasone [facility]) 50 MCG/ACT nasal spray Place 2 sprays into the nose daily. 08/20/12   Marletta Lor, MD  triamcinolone cream (KENALOG) 0.1 % Apply 1 application topically 3 (three) times daily. For up to 1 week 01/17/13   Mariea Clonts, MD   BP 103/74 mmHg  Pulse 80  Temp(Src) 97.4 F (36.3 C) (Rectal)  SpO2 97% Physical Exam  Constitutional: She appears well-developed.  Cachectic  HENT:  Head: Normocephalic and atraumatic.  Eyes: EOM are normal. Pupils are equal, round, and reactive to light.  Neck: Normal range of motion. Neck supple.  Cardiovascular: Normal rate and regular rhythm.   Pulmonary/Chest: Effort normal and breath sounds normal. No respiratory distress. She has no wheezes. She has no rales.  Abdominal: Soft. Bowel sounds are normal. She exhibits distension and mass. There is tenderness.  Distended irregular shaped abdomen with multiple masses. Diffusely tender to palpation.  Musculoskeletal: Normal range of motion. She exhibits no edema or tenderness.  Neurological:  Awake moving all extremities. Oriented to person  Skin: Skin is warm and dry. No rash noted. No erythema.  Nursing note and vitals reviewed.   ED Course  Procedures (including critical care  time) Labs Review Labs Reviewed  COMPREHENSIVE METABOLIC PANEL - Abnormal; Notable for the following:    Glucose, Bld 112 (*)    BUN 35 (*)    Creatinine, Ser 1.23 (*)    Calcium 7.9 (*)    Total Protein 5.7 (*)    Albumin 2.3 (*)    AST 64 (*)    Alkaline Phosphatase 765 (*)    Total Bilirubin 2.1 (*)    GFR calc non Af Amer 38 (*)    GFR calc Af Amer 44 (*)    All other components within normal  limits  CBC WITH DIFFERENTIAL/PLATELET - Abnormal; Notable for the following:    WBC 18.5 (*)    RBC 3.59 (*)    MCV 102.8 (*)    RDW 16.7 (*)    Neutrophils Relative % 96 (*)    Neutro Abs 17.9 (*)    Lymphocytes Relative 2 (*)    Lymphs Abs 0.3 (*)    Monocytes Relative 2 (*)    All other components within normal limits  URINALYSIS, ROUTINE W REFLEX MICROSCOPIC - Abnormal; Notable for the following:    Color, Urine BROWN (*)    APPearance TURBID (*)    Hgb urine dipstick LARGE (*)    Bilirubin Urine MODERATE (*)    Protein, ur 30 (*)    Nitrite POSITIVE (*)    Leukocytes, UA MODERATE (*)    All other components within normal limits  URINE MICROSCOPIC-ADD ON - Abnormal; Notable for the following:    Squamous Epithelial / LPF FEW (*)    Bacteria, UA MANY (*)    All other components within normal limits  CULTURE, BLOOD (ROUTINE X 2)  CULTURE, BLOOD (ROUTINE X 2)  LIPASE, BLOOD  I-STAT TROPOININ, ED  I-STAT CG4 LACTIC ACID, ED    Imaging Review Ct Abdomen Pelvis W Contrast  02/22/2014   CLINICAL DATA:  Abdominal pain, nausea, vomiting and diarrhea. Leukocytosis. Mass seen on prior CT. History of colonic polyps in hysterectomy.  EXAM: CT ABDOMEN AND PELVIS WITH CONTRAST  TECHNIQUE: Multidetector CT imaging of the abdomen and pelvis was performed using the standard protocol following bolus administration of intravenous contrast.  CONTRAST:  62mL OMNIPAQUE IOHEXOL 300 MG/ML  SOLN  COMPARISON:  CT of the abdomen pelvis February 09, 2014  FINDINGS: LUNG BASES: Small bilateral pleural effusions, with lower lobe consolidation, air bronchograms, increased in the LEFT lung base. Partially imaged pulmonary nodules, measuring at least 5 mm in the RIGHT middle lobe. The heart is moderately enlarged, no pericardial fluid collections.  SOLID ORGANS: Similar to increased necrotic hepatic hypoenhancing masses measuring up to 12 5 x 12.3 cm, involving both lobes of the liver, predominately replacing  the hepatic parenchyma, deforming the intra-abdominal wall due to mass effect. Masses are predominantly hypodense with minimal calcifications. The liver is 2.6 cm in craniocaudad dimension. Nonvisualized spleen. LEFT adrenal gland is unremarkable, the RIGHT is difficult to identify. Pancreas is grossly normal though, limited assessment.  GASTROINTESTINAL TRACT: Moderate amount of retained large bowel stool, no bowel obstruction. Approximate 3.2 x 6 cm mass in the LEFT pelvis is contiguous with the LEFT lateral wall of the sigmoid colon. Diverticulosis. Evaluation limited by lack of oral contrast.  KIDNEYS/ URINARY TRACT: Chronic severe LEFT hydronephrosis with renal parenchymal volume loss. RIGHT kidney is unremarkable. Urinary bladder is partially distended though, predominately obscured by streak artifact from RIGHT hip arthroplasty.  PERITONEUM/RETROPERITONEUM: Normal morphology, delayed urinary excretion on the RIGHT may reflects dysfunction,  at 7 minutes, the LEFT kidney fails to excrete contrast. No intraperitoneal free air. Mild calcific atherosclerosis of the aortoiliac vessels. Status post apparent hysterectomy.  SOFT TISSUE/OSSEOUS STRUCTURES: Nonsuspicious. Small spigelian hernia on the LEFT as previously noted. L4-5 auto interbody arthrodesis. Severe degenerative discs throughout the lumbar spine. No destructive bony lesions.  IMPRESSION: Multiple necrotic metastasis throughout the liver, suggesting mucinous adenocarcinoma metastasis, resulting in hepatomegaly, deforming the intra-abdominal wall.  Re- demonstration of approximate 3.2 x 6 cm mass within LEFT pelvis contiguous with the sigmoid colon may reflect primary neoplasm.  Moderate amount of ascites.  Chronic LEFT severe hydronephrosis and renal dysfunction.  Bilateral pleural effusions and lower lobe consolidations, this could represent pneumonia/aspiration.   Electronically Signed   By: Elon Alas   On: 02/22/2014 01:45   Dg Chest Port 1  View  02/22/2014   CLINICAL DATA:  Hypoxia, nausea and vomiting.  EXAM: PORTABLE CHEST - 1 VIEW  COMPARISON:  10/14/2006  FINDINGS: Shallow inspiration. Heart size and pulmonary vascularity are normal. Since the previous study, there is interval development of focal opacity in the right lung base probably representing pneumonia. Atelectasis could also have this appearance. No blunting of costophrenic angles. No pneumothorax. Degenerative changes in the spine and shoulders.  IMPRESSION: New consolidation in the right lung base probably representing pneumonia.   Electronically Signed   By: Lucienne Capers M.D.   On: 02/22/2014 00:44     EKG Interpretation None      MDM   Final diagnoses:  Abdominal pain  Hypoxia  Healthcare-associated pneumonia    Patient with bilateral pneumonia on chest x-ray and CT. CT reveals multiple masses about any obvious bowel obstruction. She has chronic left renal outflow obstruction. Improved blood pressure with IV fluids. Discussed with hospitalist and will admit    Julianne Rice, MD 02/22/14 [phone]

## 2014-02-21 NOTE — ED Notes (Signed)
Bed: WA04 Expected date:  Expected time:  Means of arrival:  Comments: EMS N/V/D, abd pain/ Hypotension / CA

## 2014-02-21 NOTE — ED Notes (Addendum)
Pt from [facility] via [facility] c/o nausea, vomiting, diarrhea and abdominal distention starting today. Pt CNA reports to EMS that patient was diagnosed with CA but unable to reports what kind. 18G Left forearm.

## 2014-02-22 ENCOUNTER — Emergency Department (HOSPITAL_COMMUNITY): Payer: Medicare Other

## 2014-02-22 ENCOUNTER — Encounter (HOSPITAL_COMMUNITY): Payer: Self-pay

## 2014-02-22 DIAGNOSIS — J690 Pneumonitis due to inhalation of food and vomit: Secondary | ICD-10-CM | POA: Diagnosis present

## 2014-02-22 DIAGNOSIS — N179 Acute kidney failure, unspecified: Secondary | ICD-10-CM | POA: Diagnosis present

## 2014-02-22 DIAGNOSIS — I10 Essential (primary) hypertension: Secondary | ICD-10-CM | POA: Diagnosis present

## 2014-02-22 DIAGNOSIS — M810 Age-related osteoporosis without current pathological fracture: Secondary | ICD-10-CM | POA: Diagnosis present

## 2014-02-22 DIAGNOSIS — A419 Sepsis, unspecified organism: Secondary | ICD-10-CM | POA: Diagnosis present

## 2014-02-22 DIAGNOSIS — J189 Pneumonia, unspecified organism: Secondary | ICD-10-CM | POA: Diagnosis present

## 2014-02-22 DIAGNOSIS — J90 Pleural effusion, not elsewhere classified: Secondary | ICD-10-CM | POA: Diagnosis present

## 2014-02-22 DIAGNOSIS — Y95 Nosocomial condition: Secondary | ICD-10-CM | POA: Diagnosis present

## 2014-02-22 DIAGNOSIS — M1990 Unspecified osteoarthritis, unspecified site: Secondary | ICD-10-CM | POA: Diagnosis present

## 2014-02-22 DIAGNOSIS — R1084 Generalized abdominal pain: Secondary | ICD-10-CM

## 2014-02-22 DIAGNOSIS — R6520 Severe sepsis without septic shock: Secondary | ICD-10-CM | POA: Diagnosis present

## 2014-02-22 DIAGNOSIS — R0902 Hypoxemia: Secondary | ICD-10-CM | POA: Diagnosis present

## 2014-02-22 DIAGNOSIS — F419 Anxiety disorder, unspecified: Secondary | ICD-10-CM | POA: Diagnosis present

## 2014-02-22 DIAGNOSIS — C787 Secondary malignant neoplasm of liver and intrahepatic bile duct: Secondary | ICD-10-CM | POA: Diagnosis present

## 2014-02-22 DIAGNOSIS — Z66 Do not resuscitate: Secondary | ICD-10-CM | POA: Diagnosis present

## 2014-02-22 DIAGNOSIS — G3184 Mild cognitive impairment, so stated: Secondary | ICD-10-CM

## 2014-02-22 DIAGNOSIS — N1330 Unspecified hydronephrosis: Secondary | ICD-10-CM | POA: Diagnosis present

## 2014-02-22 DIAGNOSIS — Z515 Encounter for palliative care: Secondary | ICD-10-CM | POA: Diagnosis not present

## 2014-02-22 DIAGNOSIS — N390 Urinary tract infection, site not specified: Secondary | ICD-10-CM | POA: Diagnosis present

## 2014-02-22 DIAGNOSIS — C801 Malignant (primary) neoplasm, unspecified: Secondary | ICD-10-CM | POA: Diagnosis present

## 2014-02-22 DIAGNOSIS — R197 Diarrhea, unspecified: Secondary | ICD-10-CM

## 2014-02-22 DIAGNOSIS — E162 Hypoglycemia, unspecified: Secondary | ICD-10-CM | POA: Diagnosis not present

## 2014-02-22 DIAGNOSIS — R112 Nausea with vomiting, unspecified: Secondary | ICD-10-CM | POA: Diagnosis present

## 2014-02-22 DIAGNOSIS — F0390 Unspecified dementia without behavioral disturbance: Secondary | ICD-10-CM | POA: Diagnosis present

## 2014-02-22 DIAGNOSIS — E860 Dehydration: Secondary | ICD-10-CM | POA: Diagnosis present

## 2014-02-22 LAB — COMPREHENSIVE METABOLIC PANEL
ALBUMIN: 2.3 g/dL — AB (ref 3.5–5.2)
ALT: 27 U/L (ref 0–35)
ALT: 29 U/L (ref 0–35)
AST: 64 U/L — AB (ref 0–37)
AST: 78 U/L — AB (ref 0–37)
Albumin: 2.3 g/dL — ABNORMAL LOW (ref 3.5–5.2)
Alkaline Phosphatase: 602 U/L — ABNORMAL HIGH (ref 39–117)
Alkaline Phosphatase: 765 U/L — ABNORMAL HIGH (ref 39–117)
Anion gap: 10 (ref 5–15)
Anion gap: 11 (ref 5–15)
BILIRUBIN TOTAL: 2 mg/dL — AB (ref 0.3–1.2)
BILIRUBIN TOTAL: 2.1 mg/dL — AB (ref 0.3–1.2)
BUN: 35 mg/dL — ABNORMAL HIGH (ref 6–23)
BUN: 37 mg/dL — ABNORMAL HIGH (ref 6–23)
CO2: 21 mmol/L (ref 19–32)
CO2: 24 mmol/L (ref 19–32)
CREATININE: 1.23 mg/dL — AB (ref 0.50–1.10)
Calcium: 7.8 mg/dL — ABNORMAL LOW (ref 8.4–10.5)
Calcium: 7.9 mg/dL — ABNORMAL LOW (ref 8.4–10.5)
Chloride: 105 mmol/L (ref 96–112)
Chloride: 106 mmol/L (ref 96–112)
Creatinine, Ser: 1.48 mg/dL — ABNORMAL HIGH (ref 0.50–1.10)
GFR calc Af Amer: 35 mL/min — ABNORMAL LOW (ref >90)
GFR calc non Af Amer: 30 mL/min — ABNORMAL LOW (ref >90)
GFR, EST AFRICAN AMERICAN: 44 mL/min — AB (ref >90)
GFR, EST NON AFRICAN AMERICAN: 38 mL/min — AB (ref >90)
Glucose, Bld: 112 mg/dL — ABNORMAL HIGH (ref 70–99)
Glucose, Bld: 98 mg/dL (ref 70–99)
Potassium: 3.7 mmol/L (ref 3.5–5.1)
Potassium: 4.9 mmol/L (ref 3.5–5.1)
Sodium: 137 mmol/L (ref 135–145)
Sodium: 140 mmol/L (ref 135–145)
TOTAL PROTEIN: 5.6 g/dL — AB (ref 6.0–8.3)
TOTAL PROTEIN: 5.7 g/dL — AB (ref 6.0–8.3)

## 2014-02-22 LAB — URINALYSIS, ROUTINE W REFLEX MICROSCOPIC
Glucose, UA: NEGATIVE mg/dL
KETONES UR: NEGATIVE mg/dL
NITRITE: POSITIVE — AB
PH: 5 (ref 5.0–8.0)
Protein, ur: 30 mg/dL — AB
SPECIFIC GRAVITY, URINE: 1.023 (ref 1.005–1.030)
UROBILINOGEN UA: 1 mg/dL (ref 0.0–1.0)

## 2014-02-22 LAB — I-STAT TROPONIN, ED: Troponin i, poc: 0 ng/mL (ref 0.00–0.08)

## 2014-02-22 LAB — CBC WITH DIFFERENTIAL/PLATELET
BASOS PCT: 0 % (ref 0–1)
Basophils Absolute: 0 10*3/uL (ref 0.0–0.1)
EOS PCT: 0 % (ref 0–5)
Eosinophils Absolute: 0 10*3/uL (ref 0.0–0.7)
HCT: 37.2 % (ref 36.0–46.0)
Hemoglobin: 12.3 g/dL (ref 12.0–15.0)
Lymphocytes Relative: 2 % — ABNORMAL LOW (ref 12–46)
Lymphs Abs: 0.5 10*3/uL — ABNORMAL LOW (ref 0.7–4.0)
MCH: 34 pg (ref 26.0–34.0)
MCHC: 33.1 g/dL (ref 30.0–36.0)
MCV: 102.8 fL — ABNORMAL HIGH (ref 78.0–100.0)
MONOS PCT: 4 % (ref 3–12)
Monocytes Absolute: 0.8 10*3/uL (ref 0.1–1.0)
NEUTROS ABS: 20.9 10*3/uL — AB (ref 1.7–7.7)
Neutrophils Relative %: 94 % — ABNORMAL HIGH (ref 43–77)
Platelets: 379 10*3/uL (ref 150–400)
RBC: 3.62 MIL/uL — AB (ref 3.87–5.11)
RDW: 17.6 % — ABNORMAL HIGH (ref 11.5–15.5)
WBC: 22.2 10*3/uL — ABNORMAL HIGH (ref 4.0–10.5)

## 2014-02-22 LAB — URINE MICROSCOPIC-ADD ON

## 2014-02-22 LAB — MRSA PCR SCREENING: MRSA by PCR: NEGATIVE

## 2014-02-22 LAB — CLOSTRIDIUM DIFFICILE BY PCR: CDIFFPCR: NEGATIVE

## 2014-02-22 LAB — CBG MONITORING, ED: Glucose-Capillary: 102 mg/dL — ABNORMAL HIGH (ref 70–99)

## 2014-02-22 LAB — I-STAT CG4 LACTIC ACID, ED: Lactic Acid, Venous: 1.83 mmol/L (ref 0.5–2.0)

## 2014-02-22 LAB — LACTIC ACID, PLASMA: Lactic Acid, Venous: 2.1 mmol/L (ref 0.5–2.0)

## 2014-02-22 LAB — LIPASE, BLOOD: Lipase: 29 U/L (ref 11–59)

## 2014-02-22 MED ORDER — CETYLPYRIDINIUM CHLORIDE 0.05 % MT LIQD
7.0000 mL | Freq: Two times a day (BID) | OROMUCOSAL | Status: DC
  Administered 2014-02-22 – 2014-02-24 (×4): 7 mL via OROMUCOSAL

## 2014-02-22 MED ORDER — VANCOMYCIN HCL IN DEXTROSE 750-5 MG/150ML-% IV SOLN
750.0000 mg | INTRAVENOUS | Status: DC
  Administered 2014-02-23: 750 mg via INTRAVENOUS
  Filled 2014-02-22: qty 150

## 2014-02-22 MED ORDER — FLUTICASONE PROPIONATE 50 MCG/ACT NA SUSP: 2.0000 | Freq: Every day | NASAL | Status: DC

## 2014-02-22 MED ORDER — IOHEXOL 300 MG/ML  SOLN
60.0000 mL | Freq: Once | INTRAMUSCULAR | Status: AC | PRN
  Administered 2014-02-22: 60 mL via INTRAVENOUS

## 2014-02-22 MED ORDER — SODIUM CHLORIDE 0.9 % IV SOLN
INTRAVENOUS | Status: AC
  Administered 2014-02-22: 14:00:00 via INTRAVENOUS

## 2014-02-22 MED ORDER — SODIUM CHLORIDE 0.9 % IV BOLUS (SEPSIS)
500.0000 mL | INTRAVENOUS | Status: DC | PRN
  Administered 2014-02-22 – 2014-02-23 (×8): 500 mL via INTRAVENOUS

## 2014-02-22 MED ORDER — VANCOMYCIN HCL IN DEXTROSE 1-5 GM/200ML-% IV SOLN
1000.0000 mg | Freq: Once | INTRAVENOUS | Status: AC
  Administered 2014-02-22: 1000 mg via INTRAVENOUS
  Filled 2014-02-22: qty 200

## 2014-02-22 MED ORDER — ALPRAZOLAM 0.25 MG PO TABS
0.2500 mg | ORAL_TABLET | Freq: Two times a day (BID) | ORAL | Status: DC | PRN
  Administered 2014-02-22 – 2014-02-23 (×2): 0.25 mg via ORAL
  Filled 2014-02-22 (×2): qty 1

## 2014-02-22 MED ORDER — IOHEXOL 300 MG/ML  SOLN: 50.0000 mL | Freq: Once | INTRAMUSCULAR | Status: AC | PRN

## 2014-02-22 MED ORDER — SODIUM CHLORIDE 0.9 % IV BOLUS (SEPSIS)
1000.0000 mL | Freq: Once | INTRAVENOUS | Status: AC
  Administered 2014-02-22: 1000 mL via INTRAVENOUS

## 2014-02-22 MED ORDER — DONEPEZIL HCL 10 MG PO TABS: 10.0000 mg | ORAL_TABLET | Freq: Every day | ORAL | Status: DC

## 2014-02-22 MED ORDER — ONDANSETRON HCL 4 MG/2ML IJ SOLN: 4.0000 mg | Freq: Three times a day (TID) | INTRAMUSCULAR | Status: DC | PRN

## 2014-02-22 MED ORDER — SERTRALINE HCL 25 MG PO TABS
12.5000 mg | ORAL_TABLET | Freq: Every day | ORAL | Status: DC
  Administered 2014-02-22: 12.5 mg via ORAL
  Filled 2014-02-22 (×3): qty 0.5

## 2014-02-22 MED ORDER — HEPARIN SODIUM (PORCINE) 5000 UNIT/ML IJ SOLN
5000.0000 [IU] | Freq: Three times a day (TID) | INTRAMUSCULAR | Status: DC
  Administered 2014-02-22 – 2014-02-24 (×5): 5000 [IU] via SUBCUTANEOUS
  Filled 2014-02-22 (×8): qty 1

## 2014-02-22 MED ORDER — SODIUM CHLORIDE 0.9 % IV BOLUS (SEPSIS)
500.0000 mL | Freq: Once | INTRAVENOUS | Status: AC
  Administered 2014-02-22: 500 mL via INTRAVENOUS

## 2014-02-22 MED ORDER — PIPERACILLIN-TAZOBACTAM 3.375 G IVPB 30 MIN
3.3750 g | Freq: Once | INTRAVENOUS | Status: AC
  Administered 2014-02-22: 3.375 g via INTRAVENOUS
  Filled 2014-02-22: qty 50

## 2014-02-22 MED ORDER — DEXTROSE 5 % IV SOLN
1.0000 g | INTRAVENOUS | Status: DC
  Administered 2014-02-22 – 2014-02-23 (×2): 1 g via INTRAVENOUS
  Filled 2014-02-22 (×3): qty 1

## 2014-02-22 NOTE — Progress Notes (Signed)
PT Cancellation Note  Patient Details Name: Hayley Cummings MRN: [id number] DOB: 1922-07-28   Noted pt on Strict bedrest orders and STAT bed rest orders. We will need activity orders and these discontinued before we are able to work with the patient from a PT and/or OT standpoint. Please don't hesitate to page [place] when pt able to be mobilized.  Thank you  Clide Dales 02/22/2014, 10:22 AM  Clide Dales, PT Pager: [phone] 02/22/2014

## 2014-02-22 NOTE — H&P (Signed)
[facility] History and Physical  Hayley Cummings DJM:[id number] DOB: Jul 19, 1922 DOA: 02/08/2014  Referring physician: ED physician PCP: Hayley Cowden, MD  Specialists:   Chief Complaint: Nausea, vomiting, diarrhea, Abdominal pain and abdominal distention  HPI: Hayley Cummings is a 81 y.o. female with past medical history of hypertension, mild dementia, recently diagnosed metastasized cancer with unknown origin, who presents with nausea, vomiting, diarrhea, abdominal pain and abdominal distention.  Patient is unable to contribute to history due to baseline dementia. Patient comes from SNF and is accompanied by two daughters. Per her daughter, patient was recently diagnosed as metastatic carcinoma with large masses in liver bilateral lungs and left pelvis. Her CT-abd/pelvis on 02/09/14 showed left-sided hydronephrosis. There is a scheduled appointment with Dr. Benay Spice late this month, but has not been seen yet. Today, Patient was found by SNF staff to have worsening abdominal distention, nausea, vomiting and episodes of diarrhea with watery stools. No recent antibiotic use. Patient does not have cough, CP or SOB.   In ED, CT-abd/pelvis showed multiple necrotic metastasis throughout the liver, suggesting mucinous adenocarcinoma metastasis, resulting in hepatomegaly. Approximate 3.2 x 6 cm mass within LEFT pelvis contiguous with the sigmoid colon may reflect primary neoplasm per radiologist. Chronic LEFT severe hydronephrosis and renal dysfunction. CXR showed new consolidation in the right lung base probably representing pneumonia. Leukocytosis with WBC 18.5. Positive urinalysis for UTI. Negative troponin. Negative lipase. Patient is admitted to inpatient for further evaluation and treatment.  Review of Systems: As presented in the history of presenting illness, rest negative.  Where does patient live?  SNF Can patient participate in ADLs? none  Allergy:  Allergies  Allergen  Reactions  . Vitamin B-12 [Cyanocobalamin] Rash    Past Medical History  Diagnosis Date  . COLONIC POLYPS, HX OF 11/25/2006  . Memory loss 05/31/2008  . MILD COGNITIVE IMPAIRMENT SO STATED 05/31/2008  . OSTEOARTHRITIS 11/30/2006  . OSTEOPOROSIS 11/25/2006    Past Surgical History  Procedure Laterality Date  . Abdominal hysterectomy    . Total hip revision    . Dilation and curettage of uterus      Social History:  reports that she has never smoked. She has never used smokeless tobacco. She reports that she does not drink alcohol or use illicit drugs.  Family History: unable to obtain due to dementia  Prior to Admission medications   Medication Sig Start Date End Date Taking? Authorizing Provider  acetaminophen (TYLENOL) 500 MG tablet Take 500 mg by mouth 2 (two) times daily.   Yes Historical Provider, MD  ALPRAZolam (XANAX) 0.25 MG tablet Take 1 tablet (0.25 mg total) by mouth 2 (two) times daily as needed for anxiety. 05/23/13  Yes Marletta Lor, MD  lisinopril (PRINIVIL,ZESTRIL) 2.5 MG tablet Take 2.5 mg by mouth daily.   Yes Historical Provider, MD  sertraline (ZOLOFT) 25 MG tablet Take 12.5 mg by mouth daily.   Yes Historical Provider, MD  donepezil (ARICEPT) 10 MG tablet Take 1 tablet (10 mg total) by mouth at bedtime. 02/14/13   Marletta Lor, MD  fluticasone [facility]) 50 MCG/ACT nasal spray Place 2 sprays into the nose daily. 08/20/12   Marletta Lor, MD  triamcinolone cream (KENALOG) 0.1 % Apply 1 application topically 3 (three) times daily. For up to 1 week 01/17/13   Mariea Clonts, MD    Physical Exam: Filed Vitals:   02/22/14 0100 02/22/14 0210 02/22/14 0222 02/22/14 0230  BP: 107/30 104/54  113/66  Pulse:  75  88  Temp:   97.4 F (36.3 C)   TempSrc:   Rectal   SpO2:  95%  96%   General: Not in acute distress HEENT:       Eyes: PERRL, EOMI, no scleral icterus       ENT: No discharge from the ears and nose, no pharynx injection, no tonsillar  enlargement.        Neck: No JVD, no bruit, no mass felt. Cardiac: S1/S2, RRR, No murmurs, No gallops or rubs Pulm: scattered rales over right side posteriorly.  Abd: Soft, distended, irregular shaped abdomen with multiple masses. Diffusely tender to palpation.  Ext: 1+ pitting leg edema bilaterally. 2+DP/PT pulse bilaterally Musculoskeletal: No joint deformities, erythema, or stiffness, ROM full Skin: No rashes.  Neuro: Alert and oriented to place, but not to person and time, cranial nerves II-XII grossly intact, moves all extremities. Brachial reflex 1+ bilaterally. Knee reflex 1+ bilaterally. Negative Babinski's sign.  Psych: Patient is not psychotic.  Labs on Admission:  Basic Metabolic Panel:  Recent Labs Lab 02/17/2014 2351  NA 140  K 3.7  CL 106  CO2 24  GLUCOSE 112*  BUN 35*  CREATININE 1.23*  CALCIUM 7.9*   Liver Function Tests:  Recent Labs Lab 02/02/2014 2351  AST 64*  ALT 27  ALKPHOS 765*  BILITOT 2.1*  PROT 5.7*  ALBUMIN 2.3*    Recent Labs Lab 02/13/2014 2351  LIPASE 29   No results for input(s): AMMONIA in the last 168 hours. CBC:  Recent Labs Lab 02/10/2014 2351  WBC 18.5*  NEUTROABS 17.9*  HGB 12.1  HCT 36.9  MCV 102.8*  PLT 311   Cardiac Enzymes: No results for input(s): CKTOTAL, CKMB, CKMBINDEX, TROPONINI in the last 168 hours.  BNP (last 3 results) No results for input(s): PROBNP in the last 8760 hours. CBG: No results for input(s): GLUCAP in the last 168 hours.  Radiological Exams on Admission: Ct Abdomen Pelvis W Contrast  02/22/2014   CLINICAL DATA:  Abdominal pain, nausea, vomiting and diarrhea. Leukocytosis. Mass seen on prior CT. History of colonic polyps in hysterectomy.  EXAM: CT ABDOMEN AND PELVIS WITH CONTRAST  TECHNIQUE: Multidetector CT imaging of the abdomen and pelvis was performed using the standard protocol following bolus administration of intravenous contrast.  CONTRAST:  86mL OMNIPAQUE IOHEXOL 300 MG/ML  SOLN   COMPARISON:  CT of the abdomen pelvis February 09, 2014  FINDINGS: LUNG BASES: Small bilateral pleural effusions, with lower lobe consolidation, air bronchograms, increased in the LEFT lung base. Partially imaged pulmonary nodules, measuring at least 5 mm in the RIGHT middle lobe. The heart is moderately enlarged, no pericardial fluid collections.  SOLID ORGANS: Similar to increased necrotic hepatic hypoenhancing masses measuring up to 12 5 x 12.3 cm, involving both lobes of the liver, predominately replacing the hepatic parenchyma, deforming the intra-abdominal wall due to mass effect. Masses are predominantly hypodense with minimal calcifications. The liver is 2.6 cm in craniocaudad dimension. Nonvisualized spleen. LEFT adrenal gland is unremarkable, the RIGHT is difficult to identify. Pancreas is grossly normal though, limited assessment.  GASTROINTESTINAL TRACT: Moderate amount of retained large bowel stool, no bowel obstruction. Approximate 3.2 x 6 cm mass in the LEFT pelvis is contiguous with the LEFT lateral wall of the sigmoid colon. Diverticulosis. Evaluation limited by lack of oral contrast.  KIDNEYS/ URINARY TRACT: Chronic severe LEFT hydronephrosis with renal parenchymal volume loss. RIGHT kidney is unremarkable. Urinary bladder is partially distended though, predominately obscured by streak artifact from RIGHT  hip arthroplasty.  PERITONEUM/RETROPERITONEUM: Normal morphology, delayed urinary excretion on the RIGHT may reflects dysfunction, at 7 minutes, the LEFT kidney fails to excrete contrast. No intraperitoneal free air. Mild calcific atherosclerosis of the aortoiliac vessels. Status post apparent hysterectomy.  SOFT TISSUE/OSSEOUS STRUCTURES: Nonsuspicious. Small spigelian hernia on the LEFT as previously noted. L4-5 auto interbody arthrodesis. Severe degenerative discs throughout the lumbar spine. No destructive bony lesions.  IMPRESSION: Multiple necrotic metastasis throughout the liver, suggesting  mucinous adenocarcinoma metastasis, resulting in hepatomegaly, deforming the intra-abdominal wall.  Re- demonstration of approximate 3.2 x 6 cm mass within LEFT pelvis contiguous with the sigmoid colon may reflect primary neoplasm.  Moderate amount of ascites.  Chronic LEFT severe hydronephrosis and renal dysfunction.  Bilateral pleural effusions and lower lobe consolidations, this could represent pneumonia/aspiration.   Electronically Signed   By: Elon Alas   On: 02/22/2014 01:45   Dg Chest Port 1 View  02/22/2014   CLINICAL DATA:  Hypoxia, nausea and vomiting.  EXAM: PORTABLE CHEST - 1 VIEW  COMPARISON:  10/14/2006  FINDINGS: Shallow inspiration. Heart size and pulmonary vascularity are normal. Since the previous study, there is interval development of focal opacity in the right lung base probably representing pneumonia. Atelectasis could also have this appearance. No blunting of costophrenic angles. No pneumothorax. Degenerative changes in the spine and shoulders.  IMPRESSION: New consolidation in the right lung base probably representing pneumonia.   Electronically Signed   By: Lucienne Capers M.D.   On: 02/22/2014 00:44    EKG: none  Assessment/Plan Principal Problem:   HCAP (healthcare-associated pneumonia) Active Problems:   Mild cognitive impairment   Osteoporosis   Cancer with unknown primary site   Sepsis   HTN (hypertension)   Nausea vomiting and diarrhea   UTI (lower urinary tract infection)  Possible HCAP:  X-ray showed  new consolidation in the right lung base probably representing pneumonia. Patient does not have symptoms for pneumonia, but has crackles on lung auscultation. she has leukocytosis with WBC 18.5. Will treated patient as HCAP now. Patient blood pressure is soft which is likely due to sepsis.  - Will admit to SDU - IV Vancomycin and cefepime  - Urine legionella and S. pneumococcal antigen - IVF: 75 cc/h - Follow up blood culture x2, sputum culture and  respiratory virus panel - Zofran for Nausea   UTI: Patient has positive urinalysis for UTI. Not very sure whether patient has symptoms due to dementia. -on Vancomycin and cefepime -follow up Ux  Metastasized cancer with unknown primary site: CT-abd/pelvis findings suggest colon cancer. Since this is widely metastasized, patient may only need palliative care. -May call Dr. Benay Spice in AM since patient ha an appoitment with him -follow up oncologist recommendation  Nausea, vomiting, diarrhea: May be related to metastasized cancer, no recent antibiotic use, need to rule out other possibility, such as C. difficile colitis. -C. difficile PCR -GI pathogen panel -IV fluid  Hypertension: Her pressure is soft on admission -Hold lisinopril  Mild dementia:  -continue donepezil  DVT ppx: SQ Heparin        Code Status: DNR Family Communication: Yes, patient's   2 daughters    at bed side Disposition Plan: Admit to inpatient   Date of Service 02/22/2014    Ivor Costa [facility] Pager [phone]  If 7PM-7AM, please contact night-coverage [email or web address] Password [facility] 02/22/2014, 4:35 AM

## 2014-02-22 NOTE — ED Notes (Signed)
Pt transported to CT ?

## 2014-02-22 NOTE — Progress Notes (Signed)
ANTIBIOTIC CONSULT NOTE - INITIAL  Pharmacy Consult for Vancomycin & may adjust abx,  Indication: pneumonia  Allergies  Allergen Reactions  . Vitamin B-12 [Cyanocobalamin] Rash   Patient Measurements: Weight: 115 lb (52.164 kg)  Vital Signs: Temp: 98.9 F (37.2 C) (01/27 0913) Temp Source: Oral (01/27 0913) BP: 100/58 mmHg (01/27 0918) Pulse Rate: 80 (01/27 0918) Intake/Output from previous day:   Intake/Output from this shift:    Labs:  Recent Labs  01/29/2014 2351  WBC 18.5*  HGB 12.1  PLT 311  CREATININE 1.23*   CrCl cannot be calculated (Unknown ideal weight.). No results for input(s): VANCOTROUGH, VANCOPEAK, VANCORANDOM, GENTTROUGH, GENTPEAK, GENTRANDOM, TOBRATROUGH, TOBRAPEAK, TOBRARND, AMIKACINPEAK, AMIKACINTROU, AMIKACIN in the last 72 hours.   Microbiology: No results found for this or any previous visit (from the past 720 hour(s)).  Medical History: Past Medical History  Diagnosis Date  . COLONIC POLYPS, HX OF 11/25/2006  . Memory loss 05/31/2008  . MILD COGNITIVE IMPAIRMENT SO STATED 05/31/2008  . OSTEOARTHRITIS 11/30/2006  . OSTEOPOROSIS 11/25/2006   Medications:  Scheduled:  . heparin  5,000 Units Subcutaneous 3 times per day  . sertraline  12.5 mg Oral Daily   Anti-infectives    Start     Dose/Rate Route Frequency Ordered Stop   02/22/14 1000  ceFEPIme (MAXIPIME) 1 g in dextrose 5 % 50 mL IVPB     1 g100 mL/hr over 30 Minutes Intravenous Every 24 hours 02/22/14 0924 03/02/14 0959   02/22/14 0200  piperacillin-tazobactam (ZOSYN) IVPB 3.375 g     3.375 g100 mL/hr over 30 Minutes Intravenous  Once 02/22/14 0158 02/22/14 0319   02/22/14 0200  vancomycin (VANCOCIN) IVPB 1000 mg/200 mL premix     1,000 mg200 mL/hr over 60 Minutes Intravenous  Once 02/22/14 0158 02/22/14 0440     Assessment: 13 yoF, SNF patient with probable PNA, lung consolidation per CXray,. Hx of Ca with unknown primary (possible colon), mets to lung, liver, pelvis, not yet seen by  oncology. Also c/o N/V/D and abd distention at SNF.  Received Zosyn 3.375gm, Vancomycin 1gm ~ 2am in ED  Begin Vancomycin, Cefepime with pharmacy dosing assistance  Goal of Therapy:  Vancomycin trough level 15-20 mcg/ml  Plan:   Received Vanc 1gm in ED, follow with 750mg  q24  Cefepime 1gm q24 x 8 doses  Follow cultures, renal function, adjust as needed  Minda Ditto PharmD Pager [phone] 02/22/2014, 10:07 AM

## 2014-02-22 NOTE — ED Notes (Signed)
Pt had medium brown soft stool. Pt was incontinent. Pt cleaned with peri-cleanser and new pads placed.

## 2014-02-22 NOTE — Consult Note (Signed)
WOC wound consult note Reason for Consult: Buttocks and perineal area with erythema secondary to incontinence associated dermatitis.  Previously healed pressure ulcers in this region.  Intact heels.  Patient is incontinence of B&B. Scattered skin tears on the upper extremities, the largest on the RUE measures 1.5cm x 1cm x 0.2cm (partial thickness). Wound type:Moisture associated skin damage (MASD), specifically incontinence associated dermatitis (IAD) Pressure Ulcer POA: No Measurement: Erythematous, blanchable erythema on buttocks and in perineal area. Wound bed:No open wounds Drainage (amount, consistency, odor) None Periwound:intact Dressing procedure/placement/frequency:Patient is on a therapeutic mattress with low air loss feature and is being turned and repositioned at least every 2 hours per protocol. She has double incontinence and the stools are loose.  I have implemented twice daily and PRN cleansing with our house no-rinse, pH balanced skin cleanser to be followed by our clear zinc oxide moisture barrier ointment. I will provide pressure redistribution heel boots and guidance for the nursing staff regarding her skin tears on the upper extremities. [place] nursing team will not follow, but will remain available to this patient, the nursing and medical team.  Please re-consult if needed. Thanks, Maudie Flakes, MSN, RN, [place], [place], [place] [phone])

## 2014-02-22 NOTE — ED Notes (Signed)
Mouth swab used for oral care and to moisten mouth.

## 2014-02-22 NOTE — Progress Notes (Signed)
Clinical Social Work Department BRIEF PSYCHOSOCIAL ASSESSMENT 02/22/2014  Patient:  Hayley Cummings, Hayley Cummings     Account Number:  [id number]     Admit date:  02/18/2014  Clinical Social Worker:  Shanvi Moyd Inez Catalina  Date/Time:  02/22/2014 08:57 AM  Referred by:  CSW  Date Referred:  02/22/2014 Referred for  ALF Placement  SNF Placement   Other Referral:   Interview type:  Family Other interview type:   pt daughter Adam Phenix [phone]    PSYCHOSOCIAL DATA Living Status:  FACILITY Admitted from facility:  [facility] Level of care:  Assisted Living Primary support name:  Adam Phenix Primary support relationship to patient:  CHILD, ADULT Degree of support available:   strong    Pt daughter Eustace Pen also supportive [place]  Other Concerns:    SOCIAL WORK ASSESSMENT / PLAN CSW met with pt at bedside, however unable to engage in psychosocial at this time. Pt only alert to place and not to self or time or situation due to dementia.    CSW spoke with pt daughter, Jonathon Bellows, by phone who confirmed patient is a resident at [facility] assisted living. Patient daughter shared that patietn has been active with [facility] outpatient rehab however canceled the last 2 sessions due to patient health decline. Patient daughter shared that she hopes for patient to return to [facility] when [place] stable. CSW and pt daughter discussed possiblity of patient needing skilled nursing for short term rehab if recommended by physical therapy before returning to [facility]. Pt daughter requested CSW to email list of facilities to [email or web address] to begin resarching in case patient needs skilled nursing placement before returning to [facility]. CSW also left message with pt other daughter, Finis Bud.    Pt daughter very supportive to patient. Patient father was at [facility] in 2014 and family is familiar with process  of skilled nursing and assisted living placement.    CSW will complete fl2 for MD signature. CSW awaiting further evaluation to determine pt disposition of returning to assisted living or skilled nursing placement.   Assessment/plan status:  Psychosocial Support/Ongoing Assessment of Needs Other assessment/ plan:   Information/referral to community resources:   skilled nursing facility list-emailed    PATIENT'S/FAMILY'S RESPONSE TO PLAN OF CARE: Pt daughter Ms. Wheelus thanked csw for concern and support. Pt family hopeful for patient to return to [facility] once medically stable however interested in skilled nursing placement if needed.       Noreene Larsson [phone]  ED CSW 02/22/2014 9:03 AM

## 2014-02-22 NOTE — Progress Notes (Addendum)
Progress Note   Hayley Cummings IEP:[id number] DOB: 12-Feb-1922 DOA: 02/02/2014 PCP: Nyoka Cowden, MD   Brief Narrative:   Hayley Cummings is an 81 y.o. female with PMH of hypertension, dementia, metastatic cancer of uncertain primary was admitted 02/22/14 with a chief complaint of nausea, vomiting, diarrhea, abdominal pain/distention.  In ED, CT-abd/pelvis showed multiple necrotic metastasis throughout the liver, suggesting mucinous adenocarcinoma metastasis, resulting in hepatomegaly. Approximate 3.2 x 6 cm mass within LEFT pelvis contiguous with the sigmoid colon may reflect primary neoplasm per radiologist. Chronic LEFT severe hydronephrosis and renal dysfunction. CXR showed new consolidation in the right lung base probably representing pneumonia. Leukocytosis with WBC 18.5. Positive urinalysis for UTI. Negative troponin. Negative lipase.   Assessment/Plan:   Principal Problem:   Sepsis secondary to HCAP (healthcare-associated pneumonia)  Currently being managed with empiric IV vancomycin and cefepime.   Follow-up MRSA nasal swab, respiratory virus panel, sputum culture, blood cultures, strep/Legionella antigens.  Lactic acid level elevated at 2.1.  Blood pressure remains soft, continue IV fluids.  Active Problems:   Mild cognitive impairment  Stable, at baseline.  Aricept discontinued.    Cancer with unknown primary site  Patient is a DNR, has outpatient F/U with oncology.    Nausea vomiting and diarrhea  Rule out C. Difficile.  F/U GI pathogen profile.    UTI (lower urinary tract infection)  On empiric antibiotics, F/U urine culture.    DVT Prophylaxis  Continue heparin.  Code Status: DNR Family Communication: Hayley Cummings [phone]) updated by telephone.  Hayley Cummings is POA, and is first contact as Hayley Cummings is going out of town. Disposition Plan: Home when stable.   IV Access:    Peripheral IV   Procedures and diagnostic studies:   Ct  Abdomen Pelvis W Contrast 02/22/2014: Multiple necrotic metastasis throughout the liver, suggesting mucinous adenocarcinoma metastasis, resulting in hepatomegaly, deforming the intra-abdominal wall.  Re- demonstration of approximate 3.2 x 6 cm mass within LEFT pelvis contiguous with the sigmoid colon may reflect primary neoplasm.  Moderate amount of ascites.  Chronic LEFT severe hydronephrosis and renal dysfunction.  Bilateral pleural effusions and lower lobe consolidations, this could represent pneumonia/aspiration.     Dg Chest Port 1 View 02/22/2014: New consolidation in the right lung base probably representing pneumonia.     Medical Consultants:    Palliative Care  Anti-Infectives:    Vancomycin 02/21/13--->  Cefepime 02/21/13--->   Subjective:   Hayley Cummings is thirsty.  She knows she is in the hospital.  She is having episodes of diarrhea.  Her abdomin is quite tender.  She is confused.    Objective:    Filed Vitals:   02/22/14 0500 02/22/14 0530 02/22/14 0600 02/22/14 0630  BP: 97/83 105/53 103/50 103/74  Pulse: 84 80    Temp:      TempSrc:      SpO2: 94% 97%     No intake or output data in the 24 hours ending 02/22/14 6010  Exam: Gen:  NAD, frail Cardiovascular:  RRR, No M/R/G Respiratory:  Lungs diminished Gastrointestinal:  Abdomen distended, + BS Extremities:  No edema   Data Reviewed:    Labs: Basic Metabolic Panel:  Recent Labs Lab 02/09/2014 2351  NA 140  K 3.7  CL 106  CO2 24  GLUCOSE 112*  BUN 35*  CREATININE 1.23*  CALCIUM 7.9*   GFR CrCl cannot be calculated (Unknown ideal weight.). Liver Function Tests:  Recent Labs Lab 02/11/2014 2351  AST  64*  ALT 27  ALKPHOS 765*  BILITOT 2.1*  PROT 5.7*  ALBUMIN 2.3*    Recent Labs Lab 02/23/2014 2351  LIPASE 29   CBC:  Recent Labs Lab 02/03/2014 2351  WBC 18.5*  NEUTROABS 17.9*  HGB 12.1  HCT 36.9  MCV 102.8*  PLT 311   Sepsis Labs:  Recent Labs Lab 02/05/2014 2351  02/22/14 0318  WBC 18.5*  --   LATICACIDVEN  --  1.83   Microbiology No results found for this or any previous visit (from the past 240 hour(s)).   Medications:     Continuous Infusions: . sodium chloride    . sodium chloride 500 mL (02/22/14 0448)    Time spent: 35 minutes with > 50% of time discussing current diagnostic test results, clinical impression and plan of care with the patient's family.   LOS: 1 day   Mitra Duling  [facility] Pager [phone]. If unable to reach me by pager, please call my cell phone at [phone].  *Please refer to [email or web address], password [email or web address] to get updated schedule on who will round on this patient, as hospitalists switch teams weekly. If 7PM-7AM, please contact night-coverage at [email or web address], password [email or web address] for any overnight needs.  02/22/2014, 8:33 AM

## 2014-02-23 ENCOUNTER — Ambulatory Visit: Payer: Medicare Other | Admitting: Oncology

## 2014-02-23 ENCOUNTER — Ambulatory Visit: Payer: Medicare Other

## 2014-02-23 DIAGNOSIS — R112 Nausea with vomiting, unspecified: Secondary | ICD-10-CM

## 2014-02-23 DIAGNOSIS — R531 Weakness: Secondary | ICD-10-CM

## 2014-02-23 DIAGNOSIS — Z66 Do not resuscitate: Secondary | ICD-10-CM

## 2014-02-23 DIAGNOSIS — N390 Urinary tract infection, site not specified: Secondary | ICD-10-CM

## 2014-02-23 DIAGNOSIS — G893 Neoplasm related pain (acute) (chronic): Secondary | ICD-10-CM | POA: Diagnosis present

## 2014-02-23 DIAGNOSIS — R109 Unspecified abdominal pain: Secondary | ICD-10-CM | POA: Diagnosis present

## 2014-02-23 DIAGNOSIS — E860 Dehydration: Secondary | ICD-10-CM | POA: Diagnosis present

## 2014-02-23 DIAGNOSIS — N179 Acute kidney failure, unspecified: Secondary | ICD-10-CM

## 2014-02-23 DIAGNOSIS — Z515 Encounter for palliative care: Secondary | ICD-10-CM

## 2014-02-23 DIAGNOSIS — R197 Diarrhea, unspecified: Secondary | ICD-10-CM

## 2014-02-23 DIAGNOSIS — J189 Pneumonia, unspecified organism: Secondary | ICD-10-CM | POA: Diagnosis present

## 2014-02-23 LAB — CBC
HCT: 34.9 % — ABNORMAL LOW (ref 36.0–46.0)
Hemoglobin: 11.7 g/dL — ABNORMAL LOW (ref 12.0–15.0)
MCH: 34 pg (ref 26.0–34.0)
MCHC: 33.5 g/dL (ref 30.0–36.0)
MCV: 101.5 fL — AB (ref 78.0–100.0)
PLATELETS: 337 10*3/uL (ref 150–400)
RBC: 3.44 MIL/uL — AB (ref 3.87–5.11)
RDW: 17.3 % — AB (ref 11.5–15.5)
WBC: 19.1 10*3/uL — ABNORMAL HIGH (ref 4.0–10.5)

## 2014-02-23 LAB — BASIC METABOLIC PANEL
Anion gap: 12 (ref 5–15)
BUN: 45 mg/dL — ABNORMAL HIGH (ref 6–23)
CO2: 22 mmol/L (ref 19–32)
CREATININE: 1.65 mg/dL — AB (ref 0.50–1.10)
Calcium: 7.7 mg/dL — ABNORMAL LOW (ref 8.4–10.5)
Chloride: 106 mmol/L (ref 96–112)
GFR calc Af Amer: 31 mL/min — ABNORMAL LOW (ref >90)
GFR calc non Af Amer: 26 mL/min — ABNORMAL LOW (ref >90)
Glucose, Bld: 63 mg/dL — ABNORMAL LOW (ref 70–99)
Potassium: 4.5 mmol/L (ref 3.5–5.1)
SODIUM: 140 mmol/L (ref 135–145)

## 2014-02-23 LAB — STREP PNEUMONIAE URINARY ANTIGEN: Strep Pneumo Urinary Antigen: NEGATIVE

## 2014-02-23 LAB — SODIUM, URINE, RANDOM: SODIUM UR: 40 meq/L

## 2014-02-23 LAB — GLUCOSE, CAPILLARY
Glucose-Capillary: 93 mg/dL (ref 70–99)
Glucose-Capillary: 103 mg/dL — ABNORMAL HIGH (ref 70–99)

## 2014-02-23 LAB — CREATININE, URINE, RANDOM: CREATININE, URINE: 114.9 mg/dL

## 2014-02-23 MED ORDER — MORPHINE SULFATE 2 MG/ML IJ SOLN
0.5000 mg | Freq: Once | INTRAMUSCULAR | Status: AC
  Administered 2014-02-23: 0.5 mg via INTRAVENOUS

## 2014-02-23 MED ORDER — DEXTROSE 50 % IV SOLN
25.0000 mL | Freq: Once | INTRAVENOUS | Status: AC
  Administered 2014-02-23: 25 mL via INTRAVENOUS

## 2014-02-23 MED ORDER — MORPHINE SULFATE 2 MG/ML IJ SOLN
0.5000 mg | INTRAMUSCULAR | Status: DC | PRN
  Administered 2014-02-23 (×2): 0.5 mg via INTRAVENOUS
  Filled 2014-02-23 (×4): qty 1

## 2014-02-23 MED ORDER — SODIUM CHLORIDE 0.9 % IV SOLN: INTRAVENOUS | Status: DC

## 2014-02-23 MED ORDER — HALOPERIDOL LACTATE 5 MG/ML IJ SOLN: 1.0000 mg | Freq: Once | INTRAMUSCULAR | Status: DC

## 2014-02-23 MED ORDER — MORPHINE SULFATE 4 MG/ML IJ SOLN
4.0000 mg | INTRAMUSCULAR | Status: DC | PRN
  Filled 2014-02-23: qty 1

## 2014-02-23 MED ORDER — MORPHINE SULFATE 25 MG/ML IV SOLN
1.0000 mg/h | INTRAVENOUS | Status: DC
  Administered 2014-02-23: 1 mg/h via INTRAVENOUS
  Filled 2014-02-23: qty 10

## 2014-02-23 MED ORDER — SODIUM CHLORIDE 0.9 % IV SOLN
INTRAVENOUS | Status: DC
  Administered 2014-02-23 – 2014-02-24 (×2): 1000 mL via INTRAVENOUS

## 2014-02-23 NOTE — Progress Notes (Signed)
CARE MANAGEMENT NOTE 02/23/2014  Patient:  Hayley Cummings, Hayley Cummings   Account Number:  [id number]  Date Initiated:  02/23/2014  Documentation initiated by:  DAVIS,RHONDA  Subjective/Objective Assessment:   poss pna,metastic necrosis of the liver, severe left hydronephrosis     Action/Plan:   home with hospice versus [facility].   Anticipated DC Date:  02/26/2014   Anticipated DC Plan:  SKILLED NURSING FACILITY  In-house referral  Clinical Social Worker      DC Planning Services  CM consult      [facility] Choice  NA   Choice offered to / List presented to:  NA   DME arranged  NA      DME agency  NA     [place] arranged  NA      [place] agency  NA   Status of service:  In process, will continue to follow Medicare Important Message given?   (If response is "NO", the following Medicare IM given date fields will be blank) Date Medicare IM given:   Medicare IM given by:   Date Additional Medicare IM given:   Additional Medicare IM given by:    Discharge Disposition:    Per UR Regulation:  Reviewed for med. necessity/level of care/duration of stay  If discussed at [place] of Stay Meetings, dates discussed:    Comments:  Jan. 28, 16/Rhonda L. Rosana Hoes, RN, BSN, CCM/Case Management [place] [phone] No discharge needs present of time of review. will follow stay to see if can return to alf at [facility] versus snf versus inpatient hospice.

## 2014-02-23 NOTE — Progress Notes (Signed)
Progress Note   NEEVA TREW VXB:[id number] DOB: 01-12-1923 DOA: 02/02/2014 PCP: Nyoka Cowden, MD   Brief Narrative:   Hayley Cummings is an 81 y.o. female with PMH of hypertension, dementia, metastatic cancer of uncertain primary was admitted 02/22/14 with a chief complaint of nausea, vomiting, diarrhea, abdominal pain/distention.  In ED, CT-abd/pelvis showed multiple necrotic metastasis throughout the liver, suggesting mucinous adenocarcinoma metastasis, resulting in hepatomegaly. Approximate 3.2 x 6 cm mass within LEFT pelvis contiguous with the sigmoid colon may reflect primary neoplasm per radiologist. Chronic LEFT severe hydronephrosis and renal dysfunction. CXR showed new consolidation in the right lung base probably representing pneumonia. Leukocytosis with WBC 18.5. Positive urinalysis for UTI. Negative troponin. Negative lipase.   Assessment/Plan:   Principal Problem:   Sepsis secondary to HCAP (healthcare-associated pneumonia)  Currently being managed with empiric IV vancomycin and cefepime.   MRSA nasal swab negative, so we'll discontinue IV vancomycin.  Follow-up Respiratory virus panel, sputum culture, blood cultures, strep/Legionella antigens.  Lactic acid level elevated at 2.1.  Blood pressure remains soft, continue IV fluids.  Active Problems:   Mild cognitive impairment  Stable, at baseline.  Aricept discontinued.    Cancer with unknown primary site  Patient is a DNR, will be seen by the palliative care team as she is not felt to be a candidate for chemotherapy/radiation therapy given her frailty.  Transfer to floor.    Nausea vomiting and diarrhea/ Acute renal failure secondary to dehydration  C. Difficile PCR negative.  F/U GI pathogen profile.  Continue to hydrate.    UTI (lower urinary tract infection)  On empiric antibiotics, F/U urine culture.    DVT Prophylaxis  Continue heparin.  Code Status: DNR Family Communication:  Thressa Sheller [phone]) updated by telephone 02/22/14.  Adam Phenix is POA, and was updated at the bedside. Disposition Plan: From [facility], d/c back vs. SNF/residential hospice.   IV Access:    Peripheral IV   Procedures and diagnostic studies:   Ct Abdomen Pelvis W Contrast 02/22/2014: Multiple necrotic metastasis throughout the liver, suggesting mucinous adenocarcinoma metastasis, resulting in hepatomegaly, deforming the intra-abdominal wall.  Re- demonstration of approximate 3.2 x 6 cm mass within LEFT pelvis contiguous with the sigmoid colon may reflect primary neoplasm.  Moderate amount of ascites.  Chronic LEFT severe hydronephrosis and renal dysfunction.  Bilateral pleural effusions and lower lobe consolidations, this could represent pneumonia/aspiration.     Dg Chest Port 1 View 02/22/2014: New consolidation in the right lung base probably representing pneumonia.     Medical Consultants:    Palliative Care  Anti-Infectives:    Vancomycin 02/21/13---> 02/23/14  Cefepime 02/21/13--->   Subjective:   Hayley Cummings is lethargic but awakens to voice, says she feels better.  Mildly confused.  Denies pain, but quite tender when abdomin examined.    Objective:    Filed Vitals:   02/23/14 0400 02/23/14 0406 02/23/14 0500 02/23/14 0600  BP:  94/58 97/52 106/37  Pulse: 94 94 87 92  Temp: 97.9 F (36.6 C)     TempSrc: Oral     Resp: 15 20 16 23   Weight:      SpO2: 92% 92% 94% 90%    Intake/Output Summary (Last 24 hours) at 02/23/14 0802 Last data filed at 02/23/14 0600  Gross per 24 hour  Intake   1850 ml  Output      0 ml  Net   1850 ml    Exam: Gen:  NAD,  frail Cardiovascular:  RRR, No M/R/G Respiratory:  Lungs diminished Gastrointestinal:  Abdomen distended, + BS, very tender to palpation Extremities:  No edema   Data Reviewed:    Labs: Basic Metabolic Panel:  Recent Labs Lab 01/31/2014 2351 02/22/14 1030 02/23/14 0410  NA 140 137 140  K  3.7 4.9 4.5  CL 106 105 106  CO2 24 21 22   GLUCOSE 112* 98 63*  BUN 35* 37* 45*  CREATININE 1.23* 1.48* 1.65*  CALCIUM 7.9* 7.8* 7.7*   GFR CrCl cannot be calculated (Unknown ideal weight.). Liver Function Tests:  Recent Labs Lab 02/11/2014 2351 02/22/14 1030  AST 64* 78*  ALT 27 29  ALKPHOS 765* 602*  BILITOT 2.1* 2.0*  PROT 5.7* 5.6*  ALBUMIN 2.3* 2.3*    Recent Labs Lab 02/22/2014 2351  LIPASE 29   CBC:  Recent Labs Lab 01/29/2014 2351 02/22/14 0925 02/23/14 0410  WBC 18.5* 22.2* 19.1*  NEUTROABS 17.9* 20.9*  --   HGB 12.1 12.3 11.7*  HCT 36.9 37.2 34.9*  MCV 102.8* 102.8* 101.5*  PLT 311 379 337   Sepsis Labs:  Recent Labs Lab 02/12/2014 2351 02/22/14 0318 02/22/14 0925 02/22/14 1031 02/23/14 0410  WBC 18.5*  --  22.2*  --  19.1*  LATICACIDVEN  --  1.83  --  2.1*  --    Microbiology Recent Results (from the past 240 hour(s))  Clostridium Difficile by PCR     Status: None   Collection Time: 02/22/14 12:49 PM  Result Value Ref Range Status   C difficile by pcr NEGATIVE NEGATIVE Final    Comment: Performed at [facility]  MRSA PCR Screening     Status: None   Collection Time: 02/22/14 12:50 PM  Result Value Ref Range Status   MRSA by PCR NEGATIVE NEGATIVE Final    Comment:        The GeneXpert MRSA Assay (FDA approved for NASAL specimens only), is one component of a comprehensive MRSA colonization surveillance program. It is not intended to diagnose MRSA infection nor to guide or monitor treatment for MRSA infections.      Medications:   . antiseptic oral rinse  7 mL Mouth Rinse BID  . ceFEPime (MAXIPIME) IV  1 g Intravenous Q24H  . haloperidol lactate  1 mg Intravenous Once  . heparin  5,000 Units Subcutaneous 3 times per day  . sertraline  12.5 mg Oral Daily  . vancomycin  750 mg Intravenous Q24H   Continuous Infusions: . sodium chloride 500 mL (02/23/14 0306)    Time spent: 35 minutes with > 50% of time discussing  current diagnostic test results, clinical impression and plan of care with the patient's family.   LOS: 2 days   Michon Kaczmarek  [facility] Pager [phone]. If unable to reach me by pager, please call my cell phone at [phone].  *Please refer to [email or web address], password [email or web address] to get updated schedule on who will round on this patient, as hospitalists switch teams weekly. If 7PM-7AM, please contact night-coverage at [email or web address], password [email or web address] for any overnight needs.  02/23/2014, 8:02 AM

## 2014-02-23 NOTE — Progress Notes (Signed)
eLink Physician-Brief Progress Note Patient Name: Hayley Cummings DOB: May 20, 1922 MRN: [id number]   Date of Service  02/23/2014  HPI/Events of Note  Poor pain control on bolus MS which drops bp  eICU Interventions  Discussed comfort goals / better addressed at this point with continuous effusion of MS which will also avoid the peak effect on bp and limit bolus infusions of fluid to offset bolus effects of MS     Intervention Category Minor Interventions: Routine modifications to care plan (e.g. PRN medications for pain, fever)  Christinia Gully 02/23/2014, 10:55 PM

## 2014-02-23 NOTE — Consult Note (Signed)
Patient Hayley Cummings      DOB: 10/27/1922      GYJ:[id number]     Consult Note from the Palliative Medicine Team at [facility] Requested by: Dr Rama     PCP: Nyoka Cowden, MD Reason for Consultation:  Clarification of [place] and options    Phone Number:[phone]  Assessment of patients Current state: Continued physical, functional ad cognitive decline over the past several months. Slow continued weight loss.  Admitted with abdominal pain, nausea and vomiting.  Workup uncovered metastatic cancer and pneumonia.  Family faced with advanced directive decisions and anticipatory care needs.   Consult is for review of medical treatment options, clarification of goals of care and end of life issues, disposition and options, and symptom recommendation.  This NP Wadie Lessen reviewed medical records, received report from team, assessed the patient and then meet at the patient's bedside along with her two daughter Shauna Hugh and Isla Pence and Eustace Pen by telephone  to discuss diagnosis prognosis, [place], EOL wishes disposition and options.  A detailed discussion was had today regarding advanced directives.  Concepts specific to code status, artifical feeding and hydration, continued IV antibiotics and rehospitalization was had.  The difference between a aggressive medical intervention path  and a palliative comfort care path for this patient at this time was had.  Values and goals of care important to patient and family were attempted to be elicited.  Concept of Hospice and Palliative Care were discussed  Natural trajectory and expectations at EOL were discussed.  Questions and concerns addressed.  Hard Choices booklet left for review. Family encouraged to call with questions or concerns.  PMT will continue to support holistically.   Goals of Care: 1.  Code Status:DNR/DNI   2. Scope of Treatment:  Continue current treatment plan, treat the treatable.  Family is hopeful  for improvement.  All understand that patient would not be a candidate for systemic treatment.  They plan to review her advanced directives, watchful waiting and make care decisions on patient's response to treatment over the next few days.  PMT will continue to support holistically   3. Disposition: Pending outcomes, back to ALF with hospice services vs hospice facility (husband died at [facility] less than one year ago)   4. Symptom Management:   1. Anxiety/Agitation: Xanax 0.5 mg po bid prn 2. Pain: Morphine 0.5 mg IV every 2 hrs prn  5. Psychosocial:  Emotional support offer to family at bedside.  This is very difficult having just received information on patient's new cancer diagnosis and the fact that their father died less than one year ago also from cancer.  All are trying to process this new information and make well informed decisions for the patient.  6. Spiritual:  Spiritual care services to contact [place] priest     Patient Documents Completed or Given: Document Given Completed  Advanced Directives Pkt    MOST X   DNR    Gone from My Sight    Hard Choices X     Brief HPI: 81 yo female with PMH for HTN, dementia and newly diagnosed metastatic cancer of unknown primary. Admitted with abdominal pain, nausea and vomiting.  CXR significant for pneumonia   ROS: unable to illicit due to decreased cognition   PMH:  Past Medical History  Diagnosis Date  . COLONIC POLYPS, HX OF 11/25/2006  . Memory loss 05/31/2008  . MILD COGNITIVE IMPAIRMENT SO STATED 05/31/2008  . OSTEOARTHRITIS 11/30/2006  .  OSTEOPOROSIS 11/25/2006     PSH: Past Surgical History  Procedure Laterality Date  . Abdominal hysterectomy    . Total hip revision    . Dilation and curettage of uterus     I have reviewed the [place] and SH and  If appropriate update it with new information. Allergies  Allergen Reactions  . Vitamin B-12 [Cyanocobalamin] Rash   Scheduled Meds: . antiseptic oral rinse  7  mL Mouth Rinse BID  . ceFEPime (MAXIPIME) IV  1 g Intravenous Q24H  . haloperidol lactate  1 mg Intravenous Once  . heparin  5,000 Units Subcutaneous 3 times per day  . sertraline  12.5 mg Oral Daily   Continuous Infusions: . sodium chloride 500 mL (02/23/14 1109)   PRN Meds:.ALPRAZolam, morphine injection, ondansetron, sodium chloride    BP 80/38 mmHg  Pulse 87  Temp(Src) 97.7 F (36.5 C) (Oral)  Resp 17  Ht 4' 10.66" (1.49 m)  Wt 52.164 kg (115 lb)  BMI 23.50 kg/m2  SpO2 94%   PPS:30 % at best   Intake/Output Summary (Last 24 hours) at 02/23/14 1626 Last data filed at 02/23/14 1500  Gross per 24 hour  Intake   2320 ml  Output      0 ml  Net   2320 ml    Physical Exam:  General: frail cachetic, ill appearing HEENT:  Moist buccal membranes Chest:  Decreased in bases, CTA CVS: RRR Abdomen: soft +BS Neuro: lethargic  Labs: CBC    Component Value Date/Time   WBC 19.1* 02/23/2014 0410   RBC 3.44* 02/23/2014 0410   HGB 11.7* 02/23/2014 0410   HCT 34.9* 02/23/2014 0410   PLT 337 02/23/2014 0410   MCV 101.5* 02/23/2014 0410   MCH 34.0 02/23/2014 0410   MCHC 33.5 02/23/2014 0410   RDW 17.3* 02/23/2014 0410   LYMPHSABS 0.5* 02/22/2014 0925   MONOABS 0.8 02/22/2014 0925   EOSABS 0.0 02/22/2014 0925   BASOSABS 0.0 02/22/2014 0925    BMET    Component Value Date/Time   NA 140 02/23/2014 0410   K 4.5 02/23/2014 0410   CL 106 02/23/2014 0410   CO2 22 02/23/2014 0410   GLUCOSE 63* 02/23/2014 0410   GLUCOSE 82 11/18/2005 1136   BUN 45* 02/23/2014 0410   CREATININE 1.65* 02/23/2014 0410   CALCIUM 7.7* 02/23/2014 0410   GFRNONAA 26* 02/23/2014 0410   GFRAA 31* 02/23/2014 0410    CMP     Component Value Date/Time   NA 140 02/23/2014 0410   K 4.5 02/23/2014 0410   CL 106 02/23/2014 0410   CO2 22 02/23/2014 0410   GLUCOSE 63* 02/23/2014 0410   GLUCOSE 82 11/18/2005 1136   BUN 45* 02/23/2014 0410   CREATININE 1.65* 02/23/2014 0410   CALCIUM 7.7*  02/23/2014 0410   PROT 5.6* 02/22/2014 1030   ALBUMIN 2.3* 02/22/2014 1030   AST 78* 02/22/2014 1030   ALT 29 02/22/2014 1030   ALKPHOS 602* 02/22/2014 1030   BILITOT 2.0* 02/22/2014 1030   GFRNONAA 26* 02/23/2014 0410   GFRAA 31* 02/23/2014 0410     Time In Time Out Total Time Spent with Patient Total Overall Time  1330 1445 70 min 75 min    Greater than 50%  of this time was spent counseling and coordinating care related to the above assessment and plan.   Wadie Lessen NP  Palliative Medicine Team Team Phone # [phone] Pager [phone]  Discussed with Dr Rockne Menghini

## 2014-02-23 NOTE — Evaluation (Signed)
Occupational Therapy Evaluation Patient Details Name: Hayley Cummings MRN: [id number] DOB: 11/17/1922 Today's Date: 02/23/2014    History of Present Illness Misty T Terrones is an 81 y.o. female with  was admitted 02/22/14 with a chief complaint of nausea, vomiting, diarrhea, abdominal pain/distention.PMH of hypertension, dementia, metastatic cancer of uncertain primary   Clinical Impression   Pt admitted with N/V. Pt currently with functional limitations due to the deficits listed below (see OT Problem List).  Pt will benefit from skilled OT to increase their safety and independence with ADL and functional mobility for ADL to facilitate discharge to venue listed below.      Follow Up Recommendations  SNF    Equipment Recommendations  None recommended by OT    Recommendations for Other Services       Precautions / Restrictions Precautions Precautions: Fall      Mobility Bed Mobility Overal bed mobility: Needs Assistance             General bed mobility comments: did not perform .   Transfers                 General transfer comment: did not perform          ADL Overall ADL's : Needs assistance/impaired     Grooming: Wash/dry face;Bed level;Moderate assistance;Cueing for sequencing   Upper Body Bathing: Maximal assistance;Bed level                             General ADL Comments: pt very tired this OT visit.  Pts arms down beside trunk-  OT propped on pillows to increase support with BUE.                Pertinent Vitals/Pain Pain Assessment: No/denies pain Faces Pain Scale: Hurts a little bit Pain Intervention(s): Limited activity within patient's tolerance     Hand Dominance     Extremity/Trunk Assessment Upper Extremity Assessment Upper Extremity Assessment: Generalized weakness   Lower Extremity Assessment Lower Extremity Assessment: Generalized weakness       Communication     Cognition Arousal/Alertness:  Lethargic Behavior During Therapy: Flat affect Overall Cognitive Status: History of cognitive impairments - at baseline                     General Comments   pt very tired.  Pt did perform simple grooming task with A            Home Living Family/patient expects to be discharged to:: Skilled nursing facility                                 Additional Comments: pt from [facility]; she has been declining functionally since Sept per dtr but had stabilized around November per dtr--pt able to assist with transfers, sits in w/c most of day, does not self propel very often;       Prior Functioning/Environment Level of Independence: Needs assistance  Gait / Transfers Assistance Needed: pt ?~mod assist for transfers per dtr (bed to w/c, w/c to toilet); pt able to WB LEs and transfer with assist +1          OT Diagnosis: Generalized weakness   OT Problem List: Decreased strength;Decreased activity tolerance   OT Treatment/Interventions: Self-care/ADL training;DME and/or AE instruction;Patient/family education    OT Goals(Current goals can be found in the care  plan section) Acute Rehab OT Goals Patient Stated Goal: none stated  OT Frequency: Min 2X/week   Barriers to D/C:            Co-evaluation              End of Session    Activity Tolerance: Patient tolerated treatment well Patient left: in bed   Time: 1322-1331 OT Time Calculation (min): 9 min Charges:  OT General Charges $OT Visit: 1 Procedure OT Evaluation $Initial OT Evaluation Tier I: 1 Procedure G-Codes:    Payton Mccallum D Mar 02, 2014, 1:50 PM

## 2014-02-23 NOTE — Progress Notes (Signed)
CSW continuing to follow.  Pt admitted from [facility]. Pt recommending SNF at this time, but Palliative Care Meeting for Goals of Care scheduled for this afternoon per chart.  CSW to follow up on recommendations from PMT [place] in order to assist with appropriate disposition plan.  CSW to continue to follow.  Alison Murray, MSW, [place] [employer] [phone]

## 2014-02-23 NOTE — Evaluation (Addendum)
Physical Therapy Evaluation Patient Details Name: Hayley Cummings MRN: [id number] DOB: 08-19-1922 Today's Date: 02/23/2014   History of Present Illness  Hayley Cummings is an 81 y.o. female with  was admitted 02/22/14 with a chief complaint of nausea, vomiting, diarrhea, abdominal pain/distention, dx with HCAP.PMH of hypertension, dementia, metastatic cancer of uncertain primary  Clinical Impression  Pt will benefit from PT to address deficits below; Spoke at length to pt dtr regarding pt prior status and mobility goals; Dtr has realistic expectations and would like pt to be able to assist with bed mobility and basic transfers; pt has baseline dementia but does follow one step functional commands consistently, is very pleasant and cooperative during eval today; PC meeting planned for later today; will continue to follow in acute venue    Follow Up Recommendations SNF    Equipment Recommendations  None recommended by PT    Recommendations for Other Services       Precautions / Restrictions Precautions Precautions: Fall      Mobility  Bed Mobility               General bed mobility comments: bed mobility deferred today, pt BP low  Transfers                    Ambulation/Gait                Stairs            Wheelchair Mobility    Modified Rankin (Stroke Patients Only)       Balance                                             Pertinent Vitals/Pain Pain Assessment: Faces Faces Pain Scale: Hurts a little bit Pain Intervention(s): Limited activity within patient's tolerance VSS except BP 80/40    Home Living Family/patient expects to be discharged to:: Skilled nursing facility                 Additional Comments: pt from [facility]; she has been declining functionally since Sept per dtr but had stabilized around November per dtr--pt able to assist with transfers, sits in w/c most of day, does not self propel  very often;     Prior Function Level of Independence: Needs assistance   Gait / Transfers Assistance Needed: pt ?~mod assist for transfers per dtr (bed to w/c, w/c to toilet); pt able to WB LEs and transfer with assist +1           Hand Dominance        Extremity/Trunk Assessment   Upper Extremity Assessment: Generalized weakness           Lower Extremity Assessment: Generalized weakness         Communication      Cognition Arousal/Alertness: Lethargic Behavior During Therapy: Flat affect Overall Cognitive Status: History of cognitive impairments - at baseline                      General Comments General comments (skin integrity, edema, etc.): pt with bil LE pitting edema    Exercises General Exercises - Upper Extremity Shoulder Flexion: AAROM;Both;10 reps Elbow Extension: AAROM;Both;5 reps General Exercises - Lower Extremity Heel Slides: AAROM;Both;10 reps Hip ABduction/ADduction: AAROM;Both;5 reps      Assessment/Plan    PT Assessment  Patient needs continued PT services  PT Diagnosis     PT Problem List Decreased strength;Decreased range of motion;Decreased activity tolerance  PT Treatment Interventions Functional mobility training;Therapeutic activities;Therapeutic exercise;Patient/family education   PT Goals (Current goals can be found in the Care Plan section) Acute Rehab PT Goals Patient Stated Goal: none stated PT Goal Formulation: With patient Time For Goal Achievement: 03/09/14 Potential to Achieve Goals: Fair    Frequency Min 2X/week   Barriers to discharge        Co-evaluation               End of Session   Activity Tolerance: Treatment limited secondary to medical complications (Comment) Patient left: in bed;with call bell/phone within reach;with family/visitor present Nurse Communication: Mobility status         Time: 4356-8616 PT Time Calculation (min) (ACUTE ONLY): 13 min   Charges:   PT  Evaluation $Initial PT Evaluation Tier I: 1 Procedure     PT G CodesKenyon Ana 2014-03-08, 12:09 PM

## 2014-02-23 NOTE — Progress Notes (Signed)
Chaplain received referral from palliative care.  Family requesting priest for [employer] [facility] [employer].  Consulted with family and contacted [facility].

## 2014-02-24 DIAGNOSIS — E162 Hypoglycemia, unspecified: Secondary | ICD-10-CM | POA: Diagnosis present

## 2014-02-24 LAB — GI PATHOGEN PANEL BY PCR, STOOL
C difficile toxin A/B: NOT DETECTED
Campylobacter by PCR: NOT DETECTED
Cryptosporidium by PCR: NOT DETECTED
E COLI (ETEC) LT/ST: NOT DETECTED
E coli (STEC): NOT DETECTED
E coli 0157 by PCR: NOT DETECTED
G lamblia by PCR: NOT DETECTED
Norovirus GI/GII: NOT DETECTED
Rotavirus A by PCR: NOT DETECTED
Salmonella by PCR: NOT DETECTED
Shigella by PCR: NOT DETECTED

## 2014-02-24 LAB — URINE CULTURE
Colony Count: NO GROWTH
Culture: NO GROWTH

## 2014-02-24 LAB — GLUCOSE, CAPILLARY
GLUCOSE-CAPILLARY: 31 mg/dL — AB (ref 70–99)
Glucose-Capillary: 96 mg/dL (ref 70–99)

## 2014-02-24 LAB — LEGIONELLA ANTIGEN, URINE

## 2014-02-24 MED ORDER — DEXTROSE 5 % AND 0.9 % NACL IV BOLUS
500.0000 mL | Freq: Once | INTRAVENOUS | Status: AC
  Administered 2014-02-24: 500 mL via INTRAVENOUS

## 2014-02-24 MED ORDER — DEXTROSE-NACL 5-0.9 % IV SOLN
INTRAVENOUS | Status: DC
  Administered 2014-02-24: 08:00:00 via INTRAVENOUS

## 2014-02-24 MED ORDER — LORAZEPAM 2 MG/ML IJ SOLN: 0.5000 mg | INTRAMUSCULAR | Status: DC | PRN

## 2014-02-24 NOTE — Progress Notes (Signed)
CRITICAL VALUE ALERT  Critical value received:  CBG 31  Date of notification:  02/24/2014  Time of notification:  07:55  Critical value read back:Yes.    Nurse who received alert:  Lucina Mellow, RN  MD notified (1st page):  Rama  Time of first page:  07:58  MD notified (2nd page):  Time of second page:  Responding MD:    Time MD responded:

## 2014-02-24 NOTE — Progress Notes (Signed)
Chaplain received call from [place] will arrive this afternoon. For anointing.   [place], [place]

## 2014-02-24 NOTE — Progress Notes (Signed)
Progress Note   Hayley Cummings IWL:[id number] DOB: [birth year] DOA: 02/19/2014 PCP: Nyoka Cowden, MD   Brief Narrative:   Hayley Cummings is an 81 y.o. female with PMH of hypertension, dementia, metastatic cancer of uncertain primary was admitted 02/22/14 with a chief complaint of nausea, vomiting, diarrhea, abdominal pain/distention.  In ED, CT-abd/pelvis showed multiple necrotic metastasis throughout the liver, suggesting mucinous adenocarcinoma metastasis, resulting in hepatomegaly. Approximate 3.2 x 6 cm mass within LEFT pelvis contiguous with the sigmoid colon may reflect primary neoplasm per radiologist. Chronic LEFT severe hydronephrosis and renal dysfunction. CXR showed new consolidation in the right lung base probably representing pneumonia. Leukocytosis with WBC 18.5. Positive urinalysis for UTI. Negative troponin. Negative lipase.   Assessment/Plan:   Principal Problem:   Severe sepsis secondary to HCAP (healthcare-associated pneumonia)  Currently being managed with empiric IV vancomycin and cefepime.   MRSA nasal swab negative, so vancomycin was discontinued 02/23/14.  Follow-up Respiratory virus panel, sputum culture, blood cultures, strep/Legionella antigens. Blood cultures negative to date.  Blood pressure remains soft, continue IV fluids.  500 cc bolus ordered.  Active Problems:   Acute renal failure  Secondary to sepsis/hypotension.  Hydrating.  No role for pressors given DNR status/focus on comfort.    Hypoglycemia  Change IV fluids to fluids containing dextrose.    Mild cognitive impairment  Stable, at baseline.  Aricept discontinued.    Cancer with unknown primary site  Patient is a DNR, will be seen by the palliative care team as she is not felt to be a candidate for chemotherapy/radiation therapy given her frailty.  Transfer to floor.  Continue supportive care and treatment of treatable illnesses. Status post palliative care meeting with  family 02/23/14.  Now on morphine drip for comfort.  Expect an in-hospital death.    Nausea vomiting and diarrhea/ Acute renal failure secondary to dehydration  C. Difficile PCR negative.  F/U GI pathogen profile.  Continue to hydrate.    UTI (lower urinary tract infection)  On empiric antibiotics, F/U urine culture.    DVT Prophylaxis  Continue heparin.  Code Status: DNR Family Communication:  Adam Phenix is POA, and was updated at the bedside. Disposition Plan: From [facility], likely to have an in-hospital death vs. residential hospice.   IV Access:    Peripheral IV   Procedures and diagnostic studies:   Ct Abdomen Pelvis W Contrast 02/22/2014: Multiple necrotic metastasis throughout the liver, suggesting mucinous adenocarcinoma metastasis, resulting in hepatomegaly, deforming the intra-abdominal wall.  Re- demonstration of approximate 3.2 x 6 cm mass within LEFT pelvis contiguous with the sigmoid colon may reflect primary neoplasm.  Moderate amount of ascites.  Chronic LEFT severe hydronephrosis and renal dysfunction.  Bilateral pleural effusions and lower lobe consolidations, this could represent pneumonia/aspiration.     Dg Chest Port 1 View 02/22/2014: New consolidation in the right lung base probably representing pneumonia.     Medical Consultants:    Palliative Care  Anti-Infectives:    Vancomycin 02/21/13---> 02/23/14  Cefepime 02/21/13--->   Subjective:   Hayley Cummings is lethargic but awakens to voice, seems comfortable.  Had severe abdominal pain in the night, and was placed on a morphine drip for comfort.  Pain is increased with movement.    Objective:    Filed Vitals:   02/24/14 0300 02/24/14 0400 02/24/14 0500 02/24/14 0600  BP: 64/37 60/34 60/33  57/38  Pulse: 81 78 79 76  Temp:  97.4 F (36.3 C)  TempSrc:  Axillary    Resp: 14 12 11 11   Height:      Weight:      SpO2: 93% 92% 92% 93%    Intake/Output Summary (Last 24 hours)  at 02/24/14 0725 Last data filed at 02/24/14 0600  Gross per 24 hour  Intake 3526.33 ml  Output      0 ml  Net 3526.33 ml    Exam: Gen:  NAD, frail Cardiovascular:  RRR, No M/R/G Respiratory:  Lungs diminished Gastrointestinal:  Abdomen distended, + BS, very tender to palpation Extremities:  3+ edema   Data Reviewed:    Labs: Basic Metabolic Panel:  Recent Labs Lab 02/20/2014 2351 02/22/14 1030 02/23/14 0410  NA 140 137 140  K 3.7 4.9 4.5  CL 106 105 106  CO2 24 21 22   GLUCOSE 112* 98 63*  BUN 35* 37* 45*  CREATININE 1.23* 1.48* 1.65*  CALCIUM 7.9* 7.8* 7.7*   GFR Estimated Creatinine Clearance: 18 mL/min (by C-G formula based on Cr of 1.65). Liver Function Tests:  Recent Labs Lab 02/04/2014 2351 02/22/14 1030  AST 64* 78*  ALT 27 29  ALKPHOS 765* 602*  BILITOT 2.1* 2.0*  PROT 5.7* 5.6*  ALBUMIN 2.3* 2.3*    Recent Labs Lab 02/17/2014 2351  LIPASE 29   CBC:  Recent Labs Lab 02/20/2014 2351 02/22/14 0925 02/23/14 0410  WBC 18.5* 22.2* 19.1*  NEUTROABS 17.9* 20.9*  --   HGB 12.1 12.3 11.7*  HCT 36.9 37.2 34.9*  MCV 102.8* 102.8* 101.5*  PLT 311 379 337   Sepsis Labs:  Recent Labs Lab 02/01/2014 2351 02/22/14 0318 02/22/14 0925 02/22/14 1031 02/23/14 0410  WBC 18.5*  --  22.2*  --  19.1*  LATICACIDVEN  --  1.83  --  2.1*  --    Microbiology Recent Results (from the past 240 hour(s))  Culture, blood (routine x 2)     Status: None (Preliminary result)   Collection Time: 02/22/14  2:40 AM  Result Value Ref Range Status   Specimen Description BLOOD LEFT ANTECUBITAL  Final   Special Requests BOTTLES DRAWN AEROBIC AND ANAEROBIC 3CC EACH  Final   Culture   Final           BLOOD CULTURE RECEIVED NO GROWTH TO DATE CULTURE WILL BE HELD FOR 5 DAYS BEFORE ISSUING A FINAL NEGATIVE REPORT Performed at [facility]    Report Status PENDING  Incomplete  Culture, blood (routine x 2)     Status: None (Preliminary result)   Collection Time:  02/22/14  3:20 AM  Result Value Ref Range Status   Specimen Description BLOOD LEFT HAND  Final   Special Requests BOTTLES DRAWN AEROBIC AND ANAEROBIC 3CC  Final   Culture   Final           BLOOD CULTURE RECEIVED NO GROWTH TO DATE CULTURE WILL BE HELD FOR 5 DAYS BEFORE ISSUING A FINAL NEGATIVE REPORT Performed at [facility]    Report Status PENDING  Incomplete  Clostridium Difficile by PCR     Status: None   Collection Time: 02/22/14 12:49 PM  Result Value Ref Range Status   C difficile by pcr NEGATIVE NEGATIVE Final    Comment: Performed at [facility]  MRSA PCR Screening     Status: None   Collection Time: 02/22/14 12:50 PM  Result Value Ref Range Status   MRSA by PCR NEGATIVE NEGATIVE Final    Comment:  The GeneXpert MRSA Assay (FDA approved for NASAL specimens only), is one component of a comprehensive MRSA colonization surveillance program. It is not intended to diagnose MRSA infection nor to guide or monitor treatment for MRSA infections.      Medications:   . antiseptic oral rinse  7 mL Mouth Rinse BID  . ceFEPime (MAXIPIME) IV  1 g Intravenous Q24H  . haloperidol lactate  1 mg Intravenous Once  . heparin  5,000 Units Subcutaneous 3 times per day  . sertraline  12.5 mg Oral Daily   Continuous Infusions: . sodium chloride 1,000 mL (02/24/14 0417)  . morphine 2.5 mg/hr (02/23/14 2354)  . sodium chloride 500 mL (02/23/14 2201)    Time spent: 35 minutes with > 50% of time discussing current diagnostic test results, clinical impression and plan of care with the patient's family.   LOS: 3 days   [facility] Pager [phone]. If unable to reach me by pager, please call my cell phone at [phone].  *Please refer to [email or web address], password [email or web address] to get updated schedule on who will round on this patient, as hospitalists switch teams weekly. If 7PM-7AM, please contact night-coverage at [email or web address], password [email or web address] for any  overnight needs.  02/24/2014, 7:25 AM

## 2014-02-24 NOTE — Progress Notes (Signed)
Progress Note from the Palliative Medicine Team at [facility] and Plan  (0800) -patient is lethargic, ill appearing, NAD in the moment  -daughter Diane at the bedside, continued conversation regarding diagnosis, prognosis, GOC,  treatment options,  EOL wishes and anticipatory care needs  -Dr Rockne Menghini at bedside, at this time will continue to treat the treatable until other family arrive, focus of care is comfort  Later in afternoon (1600) two daughters at bedside, focus is full comfort at this time-pateint is transitioning, periods of apnea, again discuss expectations at EOL, prognosis is likely hours    Objective: Allergies  Allergen Reactions  . Vitamin B-12 [Cyanocobalamin] Rash   Scheduled Meds:  Continuous Infusions: . morphine 2.5 mg/hr (02/23/14 2354)   PRN Meds:.LORazepam, ondansetron  BP 83/45 mmHg  Pulse 78  Temp(Src) 97.4 F (36.3 C) (Oral)  Resp 9  Ht 4' 10.66" (1.49 m)  Wt 59.3 kg (130 lb 11.7 oz)  BMI 26.71 kg/m2  SpO2 93%   PPS:20 %   Pain Score: denies with head nod    Intake/Output Summary (Last 24 hours) at 02/24/14 1338 Last data filed at 02/24/14 1100  Gross per 24 hour  Intake 2781.33 ml  Output      0 ml  Net 2781.33 ml       Physical Exam:  General: unresponsive to gentle touch and verbal stimuli HEENT:  Moist buccal membranes,  Chest:   Decreased in bases, periods of apnea CVS: RRR  Labs: CBC    Component Value Date/Time   WBC 19.1* 02/23/2014 0410   RBC 3.44* 02/23/2014 0410   HGB 11.7* 02/23/2014 0410   HCT 34.9* 02/23/2014 0410   PLT 337 02/23/2014 0410   MCV 101.5* 02/23/2014 0410   MCH 34.0 02/23/2014 0410   MCHC 33.5 02/23/2014 0410   RDW 17.3* 02/23/2014 0410   LYMPHSABS 0.5* 02/22/2014 0925   MONOABS 0.8 02/22/2014 0925   EOSABS 0.0 02/22/2014 0925   BASOSABS 0.0 02/22/2014 0925    BMET    Component Value Date/Time   NA 140 02/23/2014 0410   K 4.5 02/23/2014 0410   CL 106 02/23/2014 0410   CO2 22  02/23/2014 0410   GLUCOSE 63* 02/23/2014 0410   GLUCOSE 82 11/18/2005 1136   BUN 45* 02/23/2014 0410   CREATININE 1.65* 02/23/2014 0410   CALCIUM 7.7* 02/23/2014 0410   GFRNONAA 26* 02/23/2014 0410   GFRAA 31* 02/23/2014 0410    CMP     Component Value Date/Time   NA 140 02/23/2014 0410   K 4.5 02/23/2014 0410   CL 106 02/23/2014 0410   CO2 22 02/23/2014 0410   GLUCOSE 63* 02/23/2014 0410   GLUCOSE 82 11/18/2005 1136   BUN 45* 02/23/2014 0410   CREATININE 1.65* 02/23/2014 0410   CALCIUM 7.7* 02/23/2014 0410   PROT 5.6* 02/22/2014 1030   ALBUMIN 2.3* 02/22/2014 1030   AST 78* 02/22/2014 1030   ALT 29 02/22/2014 1030   ALKPHOS 602* 02/22/2014 1030   BILITOT 2.0* 02/22/2014 1030   GFRNONAA 26* 02/23/2014 0410   GFRAA 31* 02/23/2014 0410    Time In Time Out Total Time Spent with Patient Total Overall Time  0800 0835 35 min 35 min    Greater than 50%  of this time was spent counseling and coordinating care related to the above assessment and plan.  Wadie Lessen NP  Palliative Medicine Team Team Phone # [phone] Pager [phone]  Discussed with Dr Rama 1

## 2014-02-24 NOTE — Progress Notes (Signed)
CSW continuing to follow.  CSW reviewed chart and per MD, pt likely to have an in-hospital death vs. residential hospice. No official CSW order for residential hospice at this time. If residential hospice needed, please place order to social work.  CSW to continue to follow to assist with pt disposition needs as appropriate.  Alison Murray, MSW, [place] Work [phone]

## 2014-02-24 NOTE — Progress Notes (Signed)
Text-paged MD about patient's Cefepime not being compatible with morphine drip. Due to end of life care, not sure if want to attempt a second IV or DC medication. Awaiting response.

## 2014-02-25 MED ORDER — MORPHINE SULFATE 25 MG/ML IV SOLN: 1.0000 mg/h | INTRAVENOUS | Status: DC

## 2014-02-25 MED ORDER — MORPHINE BOLUS VIA INFUSION
2.0000 mg | INTRAVENOUS | Status: DC | PRN
  Administered 2014-02-25 (×2): 2 mg via INTRAVENOUS
  Filled 2014-02-25 (×2): qty 2

## 2014-02-25 NOTE — Progress Notes (Signed)
Progress Note   Hayley Cummings HDQ:[id number] DOB: [birth year] DOA: 02/24/2014 PCP: Nyoka Cowden, MD   Brief Narrative:   Hayley Cummings is an 81 y.o. female with PMH of hypertension, dementia, metastatic cancer of uncertain primary was admitted 02/22/14 with a chief complaint of nausea, vomiting, diarrhea, abdominal pain/distention.  In ED, CT-abd/pelvis showed multiple necrotic metastasis throughout the liver, suggesting mucinous adenocarcinoma metastasis, resulting in hepatomegaly. Approximate 3.2 x 6 cm mass within LEFT pelvis contiguous with the sigmoid colon may reflect primary neoplasm per radiologist. Chronic LEFT severe hydronephrosis and renal dysfunction. CXR showed new consolidation in the right lung base probably representing pneumonia. Leukocytosis with WBC 18.5. Positive urinalysis for UTI. Negative troponin. Negative lipase.   Assessment/Plan:   Principal Problem:   Severe sepsis secondary to HCAP (healthcare-associated pneumonia)  Antibiotics discontinued, family opted for full comfort measures 02/24/14.  Follow-up Respiratory virus panel.  Sputum culture, blood cultures, negative to date and strep/Legionella antigens negative.  Active Problems:   Acute renal failure  Secondary to sepsis/hypotension. Comfort care. No further lab checks..    Hypoglycemia  Comfort care, no further lab checks. Initially treated with dextrose in IV fluids.    Mild cognitive impairment  Off Aricept, actively dying.    Cancer with unknown primary site  Patient is a DNR, was evaluated by the palliative care team as she was not felt to be a candidate for chemotherapy/radiation therapy given her frailty.  Continue comfort measures.  Now on morphine drip.  Expect an in-hospital death.    Nausea vomiting and diarrhea/ Acute renal failure secondary to dehydration  C. Difficile PCR and GI pathogen panel negative.  Discontinue contact isolation.    ? UTI (lower  urinary tract infection)  Empiric antibiotics discontinued. Cultures negative.    DVT Prophylaxis  Heparin discontinued in light of comfort care.  Code Status: DNR Family Communication:  Daughters updated at the bedside. Disposition Plan: From [facility], likely to have an in-hospital death vs. residential hospice.   IV Access:    Peripheral IV   Procedures and diagnostic studies:   Ct Abdomen Pelvis W Contrast 02/22/2014: Multiple necrotic metastasis throughout the liver, suggesting mucinous adenocarcinoma metastasis, resulting in hepatomegaly, deforming the intra-abdominal wall.  Re- demonstration of approximate 3.2 x 6 cm mass within LEFT pelvis contiguous with the sigmoid colon may reflect primary neoplasm.  Moderate amount of ascites.  Chronic LEFT severe hydronephrosis and renal dysfunction.  Bilateral pleural effusions and lower lobe consolidations, this could represent pneumonia/aspiration.     Dg Chest Port 1 View 02/22/2014: New consolidation in the right lung base probably representing pneumonia.     Medical Consultants:    Irena Reichmann, NP, Palliative Care  Anti-Infectives:    Vancomycin 02/08/2014---> 02/23/14  Cefepime 02/08/2014---> 02/24/14   Subjective:   Hayley Cummings is unresponsive with occasional moaning.  No vomiting or bowel movements.  Objective:    Filed Vitals:   02/24/14 1200 02/24/14 1330 02/24/14 1400 02/25/14 0507  BP: 83/45 76/42 78/46  135/89  Pulse: 78  79 98  Temp: 97.6 F (36.4 C)   97.6 F (36.4 C)  TempSrc: Oral   Axillary  Resp: 9   10  Height:      Weight:      SpO2: 93%  94% 90%    Intake/Output Summary (Last 24 hours) at 02/25/14 0726 Last data filed at 02/24/14 1330  Gross per 24 hour  Intake    385 ml  Output  300 ml  Net     85 ml    Exam: Gen:  NAD, frail Cardiovascular:  RRR, No M/R/G Respiratory:  Lungs diminished Gastrointestinal:  Abdomen distended, + BS, very tender to palpation Extremities:  3+  edema, no mottling.   Data Reviewed:    Labs: Basic Metabolic Panel:  Recent Labs Lab 02/18/2014 2351 02/22/14 1030 02/23/14 0410  NA 140 137 140  K 3.7 4.9 4.5  CL 106 105 106  CO2 24 21 22   GLUCOSE 112* 98 63*  BUN 35* 37* 45*  CREATININE 1.23* 1.48* 1.65*  CALCIUM 7.9* 7.8* 7.7*   GFR Estimated Creatinine Clearance: 18 mL/min (by C-G formula based on Cr of 1.65). Liver Function Tests:  Recent Labs Lab 02/09/2014 2351 02/22/14 1030  AST 64* 78*  ALT 27 29  ALKPHOS 765* 602*  BILITOT 2.1* 2.0*  PROT 5.7* 5.6*  ALBUMIN 2.3* 2.3*    Recent Labs Lab 02/08/2014 2351  LIPASE 29   CBC:  Recent Labs Lab 02/10/2014 2351 02/22/14 0925 02/23/14 0410  WBC 18.5* 22.2* 19.1*  NEUTROABS 17.9* 20.9*  --   HGB 12.1 12.3 11.7*  HCT 36.9 37.2 34.9*  MCV 102.8* 102.8* 101.5*  PLT 311 379 337   Sepsis Labs:  Recent Labs Lab 01/29/2014 2351 02/22/14 0318 02/22/14 0925 02/22/14 1031 02/23/14 0410  WBC 18.5*  --  22.2*  --  19.1*  LATICACIDVEN  --  1.83  --  2.1*  --    Microbiology Recent Results (from the past 240 hour(s))  Culture, blood (routine x 2)     Status: None (Preliminary result)   Collection Time: 02/22/14  2:40 AM  Result Value Ref Range Status   Specimen Description BLOOD LEFT ANTECUBITAL  Final   Special Requests BOTTLES DRAWN AEROBIC AND ANAEROBIC 3CC EACH  Final   Culture   Final           BLOOD CULTURE RECEIVED NO GROWTH TO DATE CULTURE WILL BE HELD FOR 5 DAYS BEFORE ISSUING A FINAL NEGATIVE REPORT Performed at [facility]    Report Status PENDING  Incomplete  Culture, blood (routine x 2)     Status: None (Preliminary result)   Collection Time: 02/22/14  3:20 AM  Result Value Ref Range Status   Specimen Description BLOOD LEFT HAND  Final   Special Requests BOTTLES DRAWN AEROBIC AND ANAEROBIC 3CC  Final   Culture   Final           BLOOD CULTURE RECEIVED NO GROWTH TO DATE CULTURE WILL BE HELD FOR 5 DAYS BEFORE ISSUING A FINAL NEGATIVE  REPORT Performed at [facility]    Report Status PENDING  Incomplete  Urine culture     Status: None   Collection Time: 02/22/14 10:29 AM  Result Value Ref Range Status   Specimen Description URINE, CATHETERIZED  Final   Special Requests NONE  Final   Colony Count NO GROWTH Performed at [facility]   Final   Culture NO GROWTH Performed at [facility]   Final   Report Status 02/24/2014 FINAL  Final  Clostridium Difficile by PCR     Status: None   Collection Time: 02/22/14 12:49 PM  Result Value Ref Range Status   C difficile by pcr NEGATIVE NEGATIVE Final    Comment: Performed at [facility]  MRSA PCR Screening     Status: None   Collection Time: 02/22/14 12:50 PM  Result Value Ref Range Status  MRSA by PCR NEGATIVE NEGATIVE Final    Comment:        The GeneXpert MRSA Assay (FDA approved for NASAL specimens only), is one component of a comprehensive MRSA colonization surveillance program. It is not intended to diagnose MRSA infection nor to guide or monitor treatment for MRSA infections.      Medications:     Continuous Infusions: . morphine 3 mg/hr (02/24/14 1819)    Time spent: 25 minutes.   LOS: 4 days   [facility] Pager [phone]. If unable to reach me by pager, please call my cell phone at [phone].  *Please refer to [email or web address], password [email or web address] to get updated schedule on who will round on this patient, as hospitalists switch teams weekly. If 7PM-7AM, please contact night-coverage at [email or web address], password [email or web address] for any overnight needs.  02/25/2014, 7:26 AM

## 2014-02-25 NOTE — Plan of Care (Signed)
Problem: Phase I Progression Outcomes Goal: OOB as tolerated unless otherwise ordered Outcome: Progressing Pt is comfort care only

## 2014-02-26 DIAGNOSIS — E162 Hypoglycemia, unspecified: Secondary | ICD-10-CM

## 2014-02-26 DIAGNOSIS — R601 Generalized edema: Secondary | ICD-10-CM | POA: Diagnosis not present

## 2014-02-26 MED ORDER — SCOPOLAMINE 1 MG/3DAYS TD PT72
1.0000 | MEDICATED_PATCH | TRANSDERMAL | Status: DC
  Administered 2014-02-26: 1.5 mg via TRANSDERMAL
  Filled 2014-02-26: qty 1

## 2014-02-26 NOTE — Progress Notes (Signed)
Progress Note   CIGI BEGA XBW:[id number] DOB: December 18, 1922 DOA: 02/25/2014 PCP: Nyoka Cowden, MD   Brief Narrative:   Hayley Cummings is an 81 y.o. female with PMH of hypertension, dementia, metastatic cancer of uncertain primary was admitted 02/22/14 with a chief complaint of nausea, vomiting, diarrhea, abdominal pain/distention.  In ED, CT-abd/pelvis showed multiple necrotic metastasis throughout the liver, suggesting mucinous adenocarcinoma metastasis, resulting in hepatomegaly. Approximate 3.2 x 6 cm mass within LEFT pelvis contiguous with the sigmoid colon may reflect primary neoplasm per radiologist. Chronic LEFT severe hydronephrosis and renal dysfunction. CXR showed new consolidation in the right lung base probably representing pneumonia. Leukocytosis with WBC 18.5. Positive urinalysis for UTI. Negative troponin. Negative lipase.   Assessment/Plan:   Principal Problem:   Severe sepsis secondary to HCAP (healthcare-associated pneumonia)  Antibiotics discontinued, family opted for full comfort measures 02/24/14.  Respiratory virus panel still pending.  Sputum culture, blood cultures, negative to date and strep/Legionella antigens negative.  Active Problems:   Acute renal failure  Secondary to sepsis/hypotension. Comfort care. No further lab checks..    Hypoglycemia  Comfort care, no further lab checks. Initially treated with dextrose in IV fluids.    Mild cognitive impairment  Off Aricept, actively dying.    Cancer with unknown primary site  Patient is a DNR, was evaluated by the palliative care team as she was not felt to be a candidate for chemotherapy/radiation therapy given her frailty.  Continue comfort measures.  Now on morphine drip.  Prognosis is now hours.    Nausea vomiting and diarrhea/ Acute renal failure secondary to dehydration  C. Difficile PCR and GI pathogen panel negative.  Discontinue contact isolation.    ? UTI (lower  urinary tract infection)  Empiric antibiotics discontinued. Cultures negative.    DVT Prophylaxis  Heparin discontinued in light of comfort care.  Code Status: DNR Family Communication:  Daughters & son updated at the bedside. Disposition Plan: In-hospital death expected.   IV Access:    Peripheral IV   Procedures and diagnostic studies:   Ct Abdomen Pelvis W Contrast 02/22/2014: Multiple necrotic metastasis throughout the liver, suggesting mucinous adenocarcinoma metastasis, resulting in hepatomegaly, deforming the intra-abdominal wall.  Re- demonstration of approximate 3.2 x 6 cm mass within LEFT pelvis contiguous with the sigmoid colon may reflect primary neoplasm.  Moderate amount of ascites.  Chronic LEFT severe hydronephrosis and renal dysfunction.  Bilateral pleural effusions and lower lobe consolidations, this could represent pneumonia/aspiration.     Dg Chest Port 1 View 02/22/2014: New consolidation in the right lung base probably representing pneumonia.     Medical Consultants:    Irena Reichmann, NP, Palliative Care  Anti-Infectives:    Vancomycin 02/23/2014---> 02/23/14  Cefepime 02/25/2014---> 02/24/14   Subjective:   Hayley Cummings is unresponsive with agonal respirations, appears comfortable.  Objective:    Filed Vitals:   02/24/14 1330 02/24/14 1400 02/25/14 0507 02/25/14 1500  BP: 76/42 78/46 135/89 57/29  Pulse:  79 98 78  Temp:   97.6 F (36.4 C) 97.4 F (36.3 C)  TempSrc:   Axillary Axillary  Resp:   10 9  Height:      Weight:      SpO2:  94% 90% 87%    Intake/Output Summary (Last 24 hours) at 02/26/14 0743 Last data filed at 02/25/14 1450  Gross per 24 hour  Intake  61.55 ml  Output      0 ml  Net  61.55 ml  Exam: Gen:  Unresponsive Cardiovascular:  RRR, No M/R/G Respiratory:  Lungs diminished with shallow, agonal respirations Gastrointestinal:  Abdomen distended, + BS Extremities:  3+ edema, no mottling.   Data Reviewed:     Labs: Basic Metabolic Panel:  Recent Labs Lab 02/26/2014 2351 02/22/14 1030 02/23/14 0410  NA 140 137 140  K 3.7 4.9 4.5  CL 106 105 106  CO2 24 21 22   GLUCOSE 112* 98 63*  BUN 35* 37* 45*  CREATININE 1.23* 1.48* 1.65*  CALCIUM 7.9* 7.8* 7.7*   GFR Estimated Creatinine Clearance: 18 mL/min (by C-G formula based on Cr of 1.65). Liver Function Tests:  Recent Labs Lab 02/05/2014 2351 02/22/14 1030  AST 64* 78*  ALT 27 29  ALKPHOS 765* 602*  BILITOT 2.1* 2.0*  PROT 5.7* 5.6*  ALBUMIN 2.3* 2.3*    Recent Labs Lab 01/30/2014 2351  LIPASE 29   CBC:  Recent Labs Lab 02/10/2014 2351 02/22/14 0925 02/23/14 0410  WBC 18.5* 22.2* 19.1*  NEUTROABS 17.9* 20.9*  --   HGB 12.1 12.3 11.7*  HCT 36.9 37.2 34.9*  MCV 102.8* 102.8* 101.5*  PLT 311 379 337   Sepsis Labs:  Recent Labs Lab 02/19/2014 2351 02/22/14 0318 02/22/14 0925 02/22/14 1031 02/23/14 0410  WBC 18.5*  --  22.2*  --  19.1*  LATICACIDVEN  --  1.83  --  2.1*  --    Microbiology Recent Results (from the past 240 hour(s))  Culture, blood (routine x 2)     Status: None (Preliminary result)   Collection Time: 02/22/14  2:40 AM  Result Value Ref Range Status   Specimen Description BLOOD LEFT ANTECUBITAL  Final   Special Requests BOTTLES DRAWN AEROBIC AND ANAEROBIC 3CC EACH  Final   Culture   Final           BLOOD CULTURE RECEIVED NO GROWTH TO DATE CULTURE WILL BE HELD FOR 5 DAYS BEFORE ISSUING A FINAL NEGATIVE REPORT Performed at [facility]    Report Status PENDING  Incomplete  Culture, blood (routine x 2)     Status: None (Preliminary result)   Collection Time: 02/22/14  3:20 AM  Result Value Ref Range Status   Specimen Description BLOOD LEFT HAND  Final   Special Requests BOTTLES DRAWN AEROBIC AND ANAEROBIC 3CC  Final   Culture   Final           BLOOD CULTURE RECEIVED NO GROWTH TO DATE CULTURE WILL BE HELD FOR 5 DAYS BEFORE ISSUING A FINAL NEGATIVE REPORT Performed at [facility]    Report Status PENDING  Incomplete  Urine culture     Status: None   Collection Time: 02/22/14 10:29 AM  Result Value Ref Range Status   Specimen Description URINE, CATHETERIZED  Final   Special Requests NONE  Final   Colony Count NO GROWTH Performed at [facility]   Final   Culture NO GROWTH Performed at [facility]   Final   Report Status 02/24/2014 FINAL  Final  Clostridium Difficile by PCR     Status: None   Collection Time: 02/22/14 12:49 PM  Result Value Ref Range Status   C difficile by pcr NEGATIVE NEGATIVE Final    Comment: Performed at [facility]  MRSA PCR Screening     Status: None   Collection Time: 02/22/14 12:50 PM  Result Value Ref Range Status   MRSA by PCR NEGATIVE NEGATIVE Final    Comment:  The GeneXpert MRSA Assay (FDA approved for NASAL specimens only), is one component of a comprehensive MRSA colonization surveillance program. It is not intended to diagnose MRSA infection nor to guide or monitor treatment for MRSA infections.      Medications:     Continuous Infusions: . morphine      Time spent: 25 minutes.   LOS: 5 days   [facility] Pager [phone]. If unable to reach me by pager, please call my cell phone at [phone].  *Please refer to [email or web address], password [email or web address] to get updated schedule on who will round on this patient, as hospitalists switch teams weekly. If 7PM-7AM, please contact night-coverage at [email or web address], password [email or web address] for any overnight needs.  02/26/2014, 7:43 AM

## 2014-02-27 LAB — RESPIRATORY VIRUS PANEL
Adenovirus: NEGATIVE
INFLUENZA A: NEGATIVE
INFLUENZA B 1: NEGATIVE
Metapneumovirus: NEGATIVE
PARAINFLUENZA 1 A: NEGATIVE
PARAINFLUENZA 2 A: NEGATIVE
PARAINFLUENZA 3 A: NEGATIVE
Respiratory Syncytial Virus A: NEGATIVE
Respiratory Syncytial Virus B: NEGATIVE
Rhinovirus: NEGATIVE

## 2014-02-27 DEATH — deceased

## 2014-02-28 LAB — CULTURE, BLOOD (ROUTINE X 2)
CULTURE: NO GROWTH
Culture: NO GROWTH

## 2014-03-01 NOTE — Clinical Documentation Improvement (Deleted)
Can the etiology of the pt's pneumonia be further specified? It is noted in the CT ABDOMEN PELVIS W CONTRAST 02/20/2014  Bilateral pleural effusions and lower lobe consolidations, this could represent pneumonia/aspiration. Also c/o N/V/D and abd distention at SNF per RPh 02/22/14, treated with Vancomycin and Cefepime.    Based on the above, can the etiology of the pneumonia be further specified? If so, please document the type/etiology of the pneumonia in the progress notes.    Possible Clinical Conditions?    Aspiration Pneumonia (POA?)  Gram Negative Pneumonia (POA?)  Bacterial pneumonia, specify type if known (POA?)  Klebsiella PNA  E Coli PNA  Other Condition  Cannot Clinically Determine   Supporting Information: Risk Factors:  Sepsis, PNA, Pleural Effusion, ARF, liver ca, dementia, hydronephrosis  Signs & Symptoms Also c/o N/V/D and abd distention at SNF per RPh 02/22/14 Pt was admitted with N/V per eval 02/23/14 Diagnostics: Treatment: piperacillin-tazobactam (ZOSYN) IVPB 3.375 g   ceFEPIme (MAXIPIME) 1 g in dextrose 5 % 50 mL IVPB    Thank You, Heloise Beecham ,RN Clinical Documentation Specialist:  [facility]

## 2014-03-28 NOTE — Progress Notes (Signed)
Pt expired at 0445. Two Rn's pronounced death. Family were at bedside. Md was notified. Post mortem protocol was followed.. The remaining of 20 cc of morphine drip  was wasted by [facility] [facility] R.N

## 2014-03-28 NOTE — Care Management Note (Signed)
    Page 1 of 2   2014/03/24     11:45:46 AM CARE MANAGEMENT NOTE 2014-03-24  Patient:  DURU, REIGER   Account Number:  [id number]  Date Initiated:  02/23/2014  Documentation initiated by:  DAVIS,RHONDA  Subjective/Objective Assessment:   poss pna,metastic necrosis of the liver, severe left hydronephrosis     Action/Plan:   home with hospice versus [facility].   Anticipated DC Date:  03/24/2014   Anticipated DC Plan:  EXPIRED  In-house referral  Clinical Social Worker  Hospice / [facility]  CM consult      [facility] Choice  NA   Choice offered to / List presented to:  NA   DME arranged  NA      DME agency  NA     [place] arranged  NA      [place] agency  NA   Status of service:  Completed, signed off Medicare Important Message given?   (If response is "NO", the following Medicare IM given date fields will be blank) Date Medicare IM given:   Medicare IM given by:   Date Additional Medicare IM given:   Additional Medicare IM given by:    Discharge Disposition:  EXPIRED  Per UR Regulation:  Reviewed for med. necessity/level of care/duration of stay  If discussed at [place] of Stay Meetings, dates discussed:    Comments:  2014/03/24 Dessa Phi RN BSN NCM [phone] Expired.  Jan. 28, 16/Rhonda L. Rosana Hoes, RN, BSN, CCM/Case Management [place] [phone] No discharge needs present of time of review. will follow stay to see if can return to alf at [facility] versus snf versus inpatient hospice.

## 2014-03-28 NOTE — Discharge Summary (Addendum)
Death Summary      [name] BJY:[id number] DOB: 1922-08-14 DOA: 2014/03/15  PCP: Nyoka Cowden, MD PCP/Office notified: Office closed, death summary faxed at [phone].  Admit date: 15-Mar-2014 Date of Death: 21-Mar-2014   Final Diagnoses:   Principal Problem:    Sepsis secondary to aspiration HCAP (healthcare-associated pneumonia) in the setting of widely metastatic cancer of uncertain primary Active Problems:    Mild cognitive impairment    Cancer with unknown primary site    HTN (hypertension)    Nausea vomiting and diarrhea    UTI (lower urinary tract infection)    ARF (acute renal failure)    Dehydration    Abdominal pain    Healthcare-associated pneumonia    Palliative care encounter    Pain, cancer    DNR (do not resuscitate)    Weakness    Hypoglycemia    Anasarca    Left hydronephrosis    Bilateral pleural effusions  History of Present Illness:    Hayley Cummings is an 81 y.o. female with PMH of hypertension, dementia, metastatic cancer of uncertain primary was admitted 02/22/14 with a chief complaint of nausea, vomiting, diarrhea, abdominal pain/distention. In ED, CT-abd/pelvis showed multiple necrotic metastasis throughout the liver, suggesting mucinous adenocarcinoma metastasis, resulting in hepatomegaly. Approximate 3.2 x 6 cm mass within LEFT pelvis contiguous with the sigmoid colon may reflect primary neoplasm per radiologist. Chronic LEFT severe hydronephrosis and renal dysfunction. CXR showed new consolidation in the right lung base probably representing pneumonia. Leukocytosis with WBC 18.5. Positive urinalysis for UTI.    Hospital Course:   The patient was admitted and placed on broad-spectrum antibiotics for treatment of healthcare associated pneumonia and possible UTI. A septic workup was undertaken. A CT scan of her abdomen and pelvis showed multiple necrotic metastasis throughout the liver suggestive of mucinous adenocarcinoma  with deformity of the intra-abdominal wall with tumor spread into the left pelvis contiguous with the sigmoid colon which was felt to be the primary area of neoplasm. She also had chronic left severe hydronephrosis and renal dysfunction related to tumor. Given her overall poor prognosis, palliative care team met with the patient and her family. Her family opted to discontinue antibiotics on 02/24/14 and pursue comfort measures. She was placed on a morphine drip for pain control. She expired peacefully with her family at the bedside.  Time of death: 49.  Signed:  Arwa Yero  [facility] 03/21/14, 7:37 AM

## 2014-03-28 DEATH — deceased

## 2015-08-06 ENCOUNTER — Encounter: Payer: Self-pay | Admitting: Internal Medicine

## 2016-11-05 IMAGING — CT CT ABD-PELV W/ CM
1 of 6 series · 11 of 32 positions shown, 17 images · IV contrast (OMNIPAQUE 300)
Comparison: CT of the abdomen pelvis February 09, 2014

CLINICAL DATA: Abdominal pain, nausea, vomiting and diarrhea.
Leukocytosis. Mass seen on prior CT. History of colonic polyps in
hysterectomy.

EXAM:
CT ABDOMEN AND PELVIS WITH CONTRAST
TECHNIQUE: Multidetector CT imaging of the abdomen and pelvis was performed
using the standard protocol following bolus administration of
intravenous contrast.
CONTRAST:  60mL OMNIPAQUE IOHEXOL 300 MG/ML  SOLN

[Series 2: abd/pel with · axial · 0.71mm/px · z∈[-530,-186]mm · 11 of 83 slices shown, 17 images]
[im 7/83  soft-tissue]
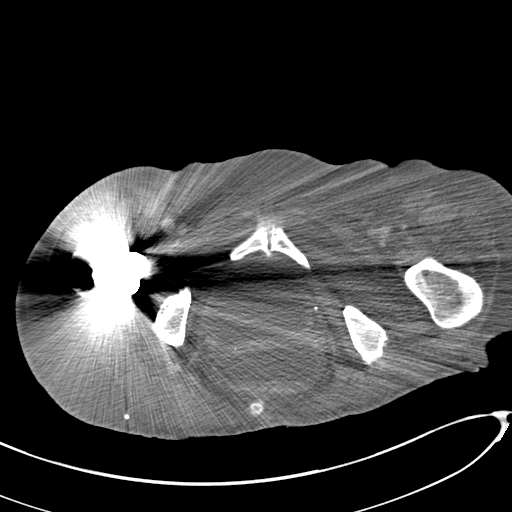
[im 7/83  bone]
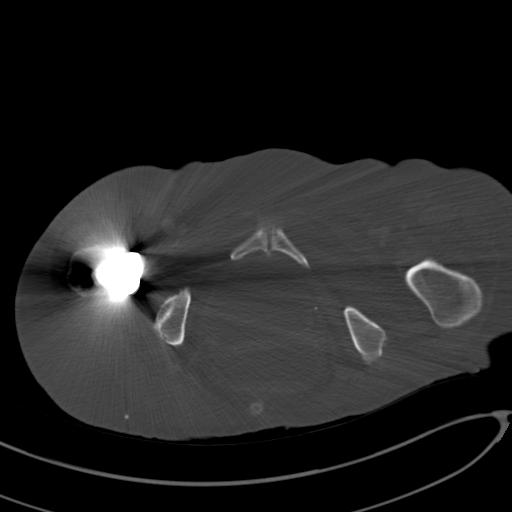
[im 14/83  soft-tissue]
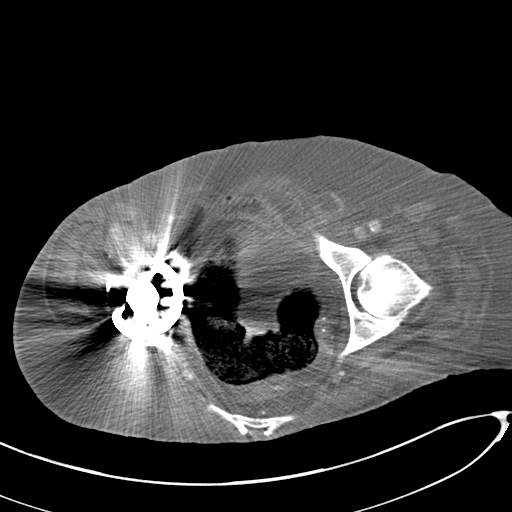
[im 21/83  soft-tissue]
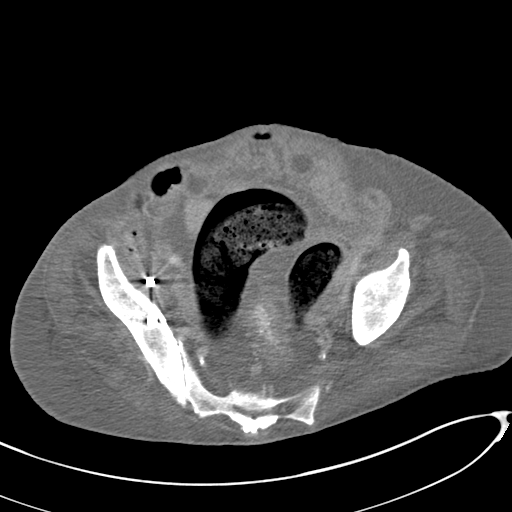
[im 28/83  soft-tissue]
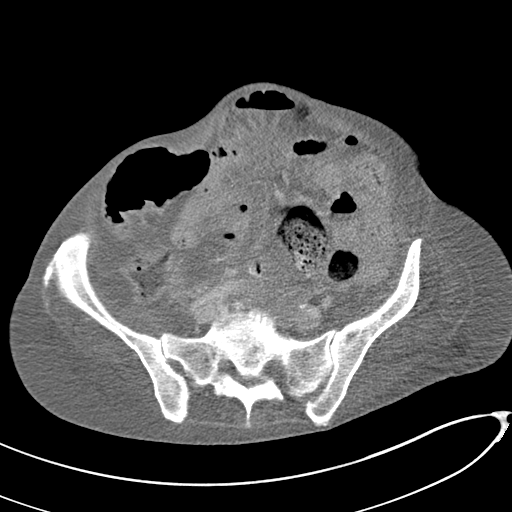
[im 35/83  soft-tissue]
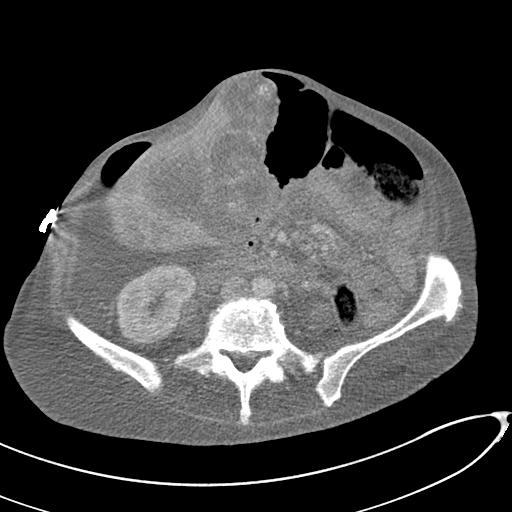
[im 42/83  soft-tissue]
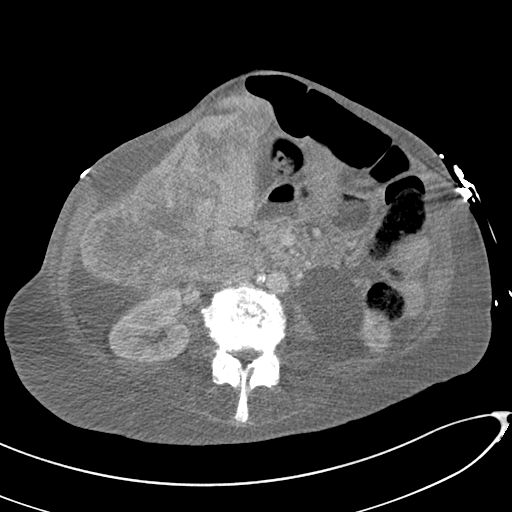
[im 48/83  soft-tissue]
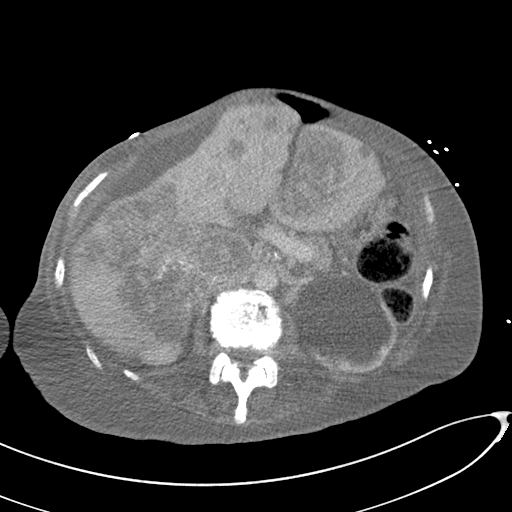
[im 55/83  soft-tissue]
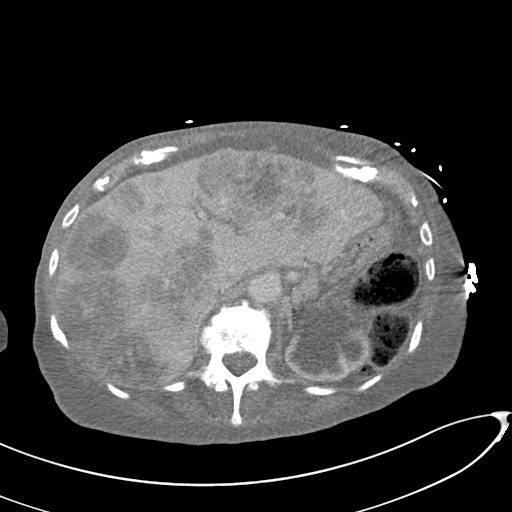
[im 55/83  lung]
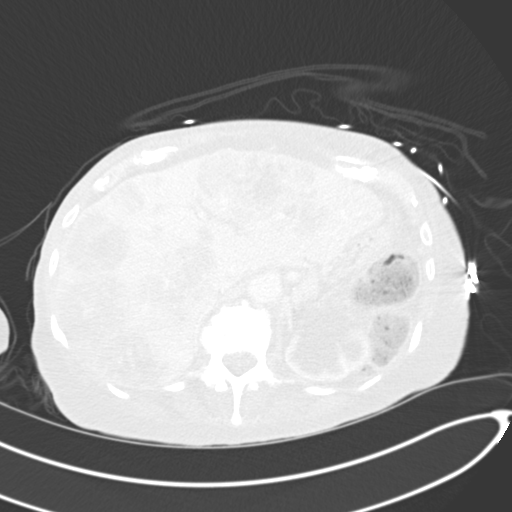
[im 62/83  soft-tissue]
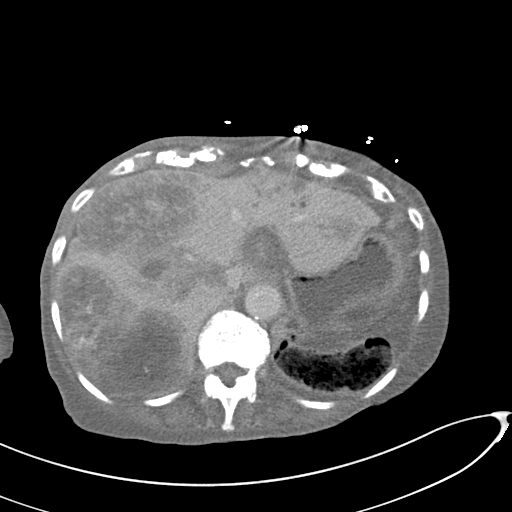
[im 62/83  lung]
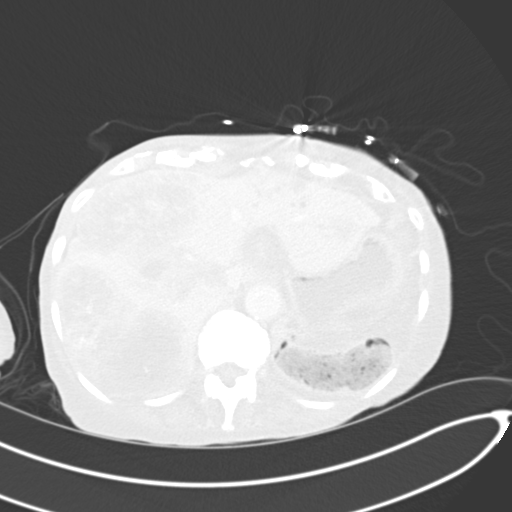
[im 62/83  bone]
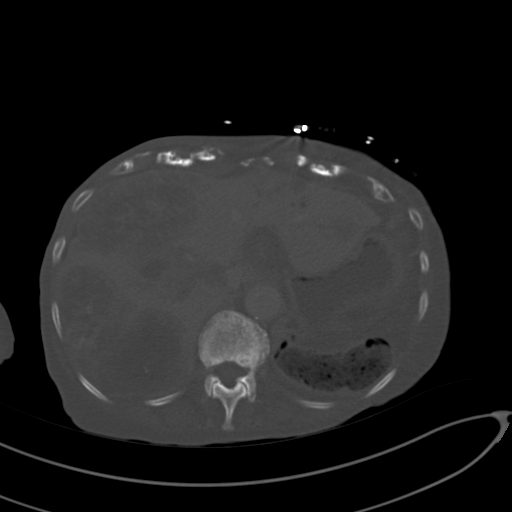
[im 69/83  soft-tissue]
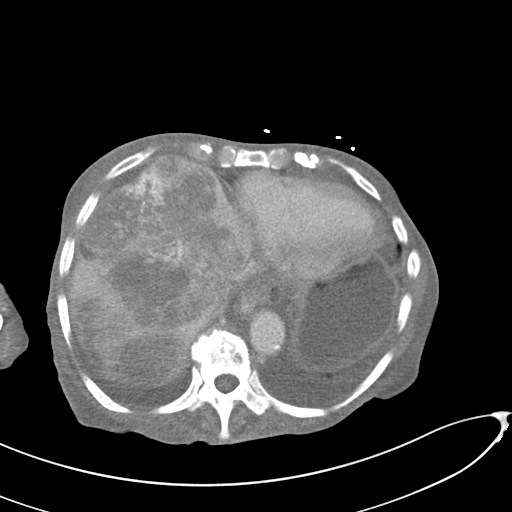
[im 69/83  lung]
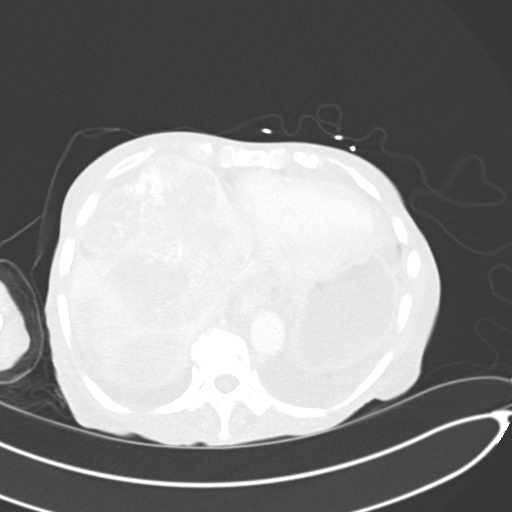
[im 76/83  soft-tissue]
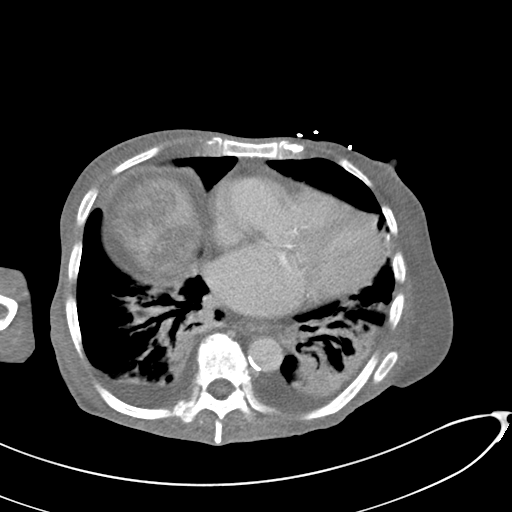
[im 76/83  lung]
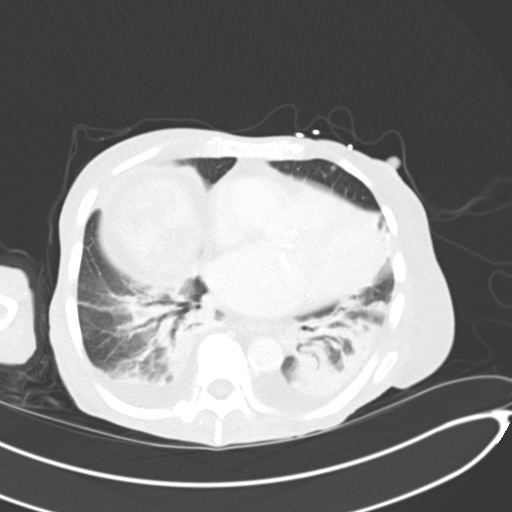

[11 of 32 positions shown; findings below may reference images not displayed]

FINDINGS: LUNG BASES: Small bilateral pleural effusions, with lower lobe
consolidation, air bronchograms, increased in the LEFT lung base.
Partially imaged pulmonary nodules, measuring at least 5 mm in the
RIGHT middle lobe. The heart is moderately enlarged, no pericardial
fluid collections.

SOLID ORGANS: Similar to increased necrotic hepatic hypoenhancing
masses measuring up to 12 5 x 12.3 cm, involving both lobes of the
liver, predominately replacing the hepatic parenchyma, deforming the
intra-abdominal wall due to mass effect. Masses are predominantly
hypodense with minimal calcifications. The liver is 2.6 cm in
craniocaudad dimension. Nonvisualized spleen. LEFT adrenal gland is
unremarkable, the RIGHT is difficult to identify. Pancreas is
grossly normal though, limited assessment.

GASTROINTESTINAL TRACT: Moderate amount of retained large bowel
stool, no bowel obstruction. Approximate 3.2 x 6 cm mass in the LEFT
pelvis is contiguous with the LEFT lateral wall of the sigmoid
colon. Diverticulosis. Evaluation limited by lack of oral contrast.

KIDNEYS/ URINARY TRACT: Chronic severe LEFT hydronephrosis with
renal parenchymal volume loss. RIGHT kidney is unremarkable. Urinary
bladder is partially distended though, predominately obscured by
streak artifact from RIGHT hip arthroplasty.

PERITONEUM/RETROPERITONEUM: Normal morphology, delayed urinary
excretion on the RIGHT may reflects dysfunction, at 7 minutes, the
LEFT kidney fails to excrete contrast. No intraperitoneal free air.
Mild calcific atherosclerosis of the aortoiliac vessels. Status post
apparent hysterectomy.

SOFT TISSUE/OSSEOUS STRUCTURES: Nonsuspicious. Small spigelian
hernia on the LEFT as previously noted. L4-5 auto interbody
arthrodesis. Severe degenerative discs throughout the lumbar spine.
No destructive bony lesions.
IMPRESSION: Multiple necrotic metastasis throughout the liver, suggesting
mucinous adenocarcinoma metastasis, resulting in hepatomegaly,
deforming the intra-abdominal wall.

Re- demonstration of approximate 3.2 x 6 cm mass within LEFT pelvis
contiguous with the sigmoid colon may reflect primary neoplasm.

Moderate amount of ascites.

Chronic LEFT severe hydronephrosis and renal dysfunction.

Bilateral pleural effusions and lower lobe consolidations, this
could represent pneumonia/aspiration.

  By: Roodlyncha Olgot
